# Patient Record
Sex: Male | Born: 1970 | Race: White | Hispanic: No | Marital: Single | Smoking: Never smoker
Health system: Southern US, Community
[De-identification: ages and names within clinical notes are randomized; demographics above are authoritative.]

## PROBLEM LIST (undated history)

## (undated) DIAGNOSIS — Z87442 Personal history of urinary calculi: Secondary | ICD-10-CM

## (undated) DIAGNOSIS — J189 Pneumonia, unspecified organism: Secondary | ICD-10-CM

## (undated) DIAGNOSIS — N200 Calculus of kidney: Secondary | ICD-10-CM

## (undated) DIAGNOSIS — K4090 Unilateral inguinal hernia, without obstruction or gangrene, not specified as recurrent: Secondary | ICD-10-CM

## (undated) DIAGNOSIS — Z8619 Personal history of other infectious and parasitic diseases: Secondary | ICD-10-CM

## (undated) DIAGNOSIS — I671 Cerebral aneurysm, nonruptured: Secondary | ICD-10-CM

## (undated) HISTORY — PX: HAND SURGERY: SHX662

## (undated) HISTORY — PX: OTHER SURGICAL HISTORY: SHX169

## (undated) HISTORY — DX: Personal history of other infectious and parasitic diseases: Z86.19

## (undated) HISTORY — PX: FRACTURE SURGERY: SHX138

## (undated) HISTORY — PX: ANKLE SURGERY: SHX546

## (undated) HISTORY — PX: APPENDECTOMY: SHX54

## (undated) NOTE — *Deleted (*Deleted)
Chief Complaint   No chief complaint on file.   HPI   Consult requested by: *** Reason for consult: ***  HPI: Chad Ruiz. is a 21 y.o. male {***}  Patient Active Problem List   Diagnosis Date Noted  . Lateral epicondylitis of right elbow 10/25/2017  . Encounter for health maintenance examination with abnormal findings 10/25/2017  . Orchitis and epididymitis 10/25/2017  . Abnormal liver enzymes 04/13/2012  . Inguinal pain, left 10/24/2011  . Pulmonary nodule 10/24/2011    PMH: Past Medical History:  Diagnosis Date  . Brain aneurysm    takes aspirin per MD  . History of chicken pox    childhood  . History of kidney stones   . Kidney calculi     PSH: Past Surgical History:  Procedure Laterality Date  . ANKLE SURGERY    . CLOSED REDUCTION NASAL FRACTURE N/A 10/17/2019   Procedure: CLOSED REDUCTION NASAL FRACTURE;  Surgeon: [name], MD;  Location: [facility];  Service: ENT;  Laterality: N/A;  . FRACTURE SURGERY    . HAND SURGERY    . HERNIA REPAIR  2015  . IR ANGIO INTRA EXTRACRAN SEL INTERNAL CAROTID BILAT MOD SED  12/14/2019  . IR ANGIO VERTEBRAL SEL VERTEBRAL BILAT MOD SED  12/14/2019  . nose  1996-1998   reconstruction x 3  . SEPTOPLASTY N/A 01/23/2020   Procedure: SEPTOPLASTY;  Surgeon: [name], MD;  Location:  SURGERY CENTER;  Service: ENT;  Laterality: N/A;  . TONSILLECTOMY  1979    No medications prior to admission.    SH: Social History   Tobacco Use  . Smoking status: Never Smoker  . Smokeless tobacco: Never Used  Vaping Use  . Vaping Use: Never used  Substance Use Topics  . Alcohol use: No  . Drug use: No    MEDS: Prior to Admission medications   Medication Sig Start Date End Date Taking? Authorizing Provider  aspirin EC 325 MG tablet Take 325 mg by mouth daily.    [provider]  aspirin-acetaminophen-caffeine (EXCEDRIN MIGRAINE) [phone] MG tablet Take 1 tablet by mouth every 6 (six)  hours as needed for headache.    [provider]  clopidogrel (PLAVIX) 75 MG tablet Take 75 mg by mouth daily. 12/26/19   [provider]  HYDROcodone-acetaminophen (NORCO) 7.5-325 MG tablet Take 1 tablet by mouth every 6 (six) hours as needed for moderate pain. Patient not taking: Reported on 02/22/2020 01/23/20   [name], MD  promethazine (PHENERGAN) 25 MG suppository Place 1 suppository (25 mg total) rectally every 6 (six) hours as needed for nausea or vomiting. Patient not taking: Reported on 02/22/2020 01/23/20   [name], MD    ALLERGY: No Known Allergies  Social History   Tobacco Use  . Smoking status: Never Smoker  . Smokeless tobacco: Never Used  Substance Use Topics  . Alcohol use: No     Family History  Problem Relation Age of Onset  . Hypertension Mother   . Skin cancer Father   . Breast cancer Maternal Grandmother   . Skin cancer Paternal Grandfather   . Heart disease Paternal Grandmother 47       stent placement  . Diabetes Brother 71  . Lung cancer Paternal Uncle        smoker  . Leukemia Paternal Uncle   . Prostate cancer Neg Hx   . Colon cancer Neg Hx      ROS   ROS  Exam   There were no vitals filed for this visit. General appearance: WDWN, NAD GCS: ***   - Eyes: 1- no eye opening, 2- eyes open to pain, 3-eyes open to command or shout, 4- eyes open spontaneously   - Verbal response: 1- no verbal response, 2- incomprehnsible speech/sound, 3- inappropriate response, 4- confused, but able to answer questions, 5= oriented  - Motor: 1= No motor, 2- extensor response, 3- spastic flexion, 4- withdraws from pain, 5= purposeful movement to pain, 6= obeys command Eyes: No scleral injection Cardiovascular: Regular rate and rhythm without murmurs, rubs, gallops. No edema or variciosities. Distal pulses normal. Pulmonary: Effort normal, non-labored breathing Musculoskeletal:     Muscle tone upper extremities: Normal    Muscle tone lower  extremities: Normal    Motor exam: Upper Extremities Deltoid Bicep Tricep Grip  Right 5/5 5/5 5/5 5/5  Left 5/5 5/5 5/5 5/5   Lower Extremity IP Quad PF DF EHL  Right 5/5 5/5 5/5 5/5 5/5  Left 5/5 5/5 5/5 5/5 5/5   Neurological Mental Status:    - Patient is awake, alert, oriented to person, place, month, year, and situation    - Patient is able to give a clear and coherent history.    - No signs of aphasia or neglect Cranial Nerves    - II: Visual Fields are full. PERRL    - III/IV/VI: EOMI without ptosis or diploplia.     - V: Facial sensation is grossly normal    - VII: Facial movement is symmetric.     - VIII: hearing is intact to voice    - X: Uvula elevates symmetrically    - XI: Shoulder shrug is symmetric.    - XII: tongue is midline without atrophy or fasciculations.  Sensory: Sensation grossly intact to LT Deep Tendon Reflexes    - 2+ and symmetric in the biceps and patellae. *** Plantars   - Toes are downgoing bilaterally. *** Cerebellar    - FNF and HKS are intact bilaterally***   Results - Imaging/Labs   No results found for this or any previous visit (from the past 48 hour(s)).  No results found.  IMAGING: {***}  Impression/Plan   - 75 y.o. male {***}  SAH scoring Fisher:  1: thin SAH, no IVH 2: thin SAH, +IVH 3: Thick SAH, no IVH 4: Thick SAH, +IVH  Hunt Hess Grade 1: Asx or mild headache Grade 2: Moderate to severe headache, nuchal rigidity, no neuro deficit Grade 3: drowsy, confused, mild focal deficit Grade 4: Stupor, moderate to severe hemiparesis Grade 5: come, posturing

---
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
## 2001-01-27 ENCOUNTER — Emergency Department (HOSPITAL_COMMUNITY): Admission: EM | Admit: 2001-01-27 | Discharge: 2001-01-27 | Payer: Self-pay | Admitting: Emergency Medicine

## 2011-09-29 ENCOUNTER — Emergency Department (INDEPENDENT_AMBULATORY_CARE_PROVIDER_SITE_OTHER): Payer: BC Managed Care – PPO

## 2011-09-29 ENCOUNTER — Encounter (HOSPITAL_BASED_OUTPATIENT_CLINIC_OR_DEPARTMENT_OTHER): Payer: Self-pay | Admitting: *Deleted

## 2011-09-29 ENCOUNTER — Emergency Department (HOSPITAL_BASED_OUTPATIENT_CLINIC_OR_DEPARTMENT_OTHER)
Admission: EM | Admit: 2011-09-29 | Discharge: 2011-09-29 | Disposition: A | Discharge status: Home / Self Care | Payer: BC Managed Care – PPO | Attending: Emergency Medicine | Admitting: Emergency Medicine

## 2011-09-29 ENCOUNTER — Emergency Department (HOSPITAL_BASED_OUTPATIENT_CLINIC_OR_DEPARTMENT_OTHER): Payer: BC Managed Care – PPO

## 2011-09-29 DIAGNOSIS — R109 Unspecified abdominal pain: Secondary | ICD-10-CM

## 2011-09-29 DIAGNOSIS — R911 Solitary pulmonary nodule: Secondary | ICD-10-CM

## 2011-09-29 DIAGNOSIS — K5730 Diverticulosis of large intestine without perforation or abscess without bleeding: Secondary | ICD-10-CM

## 2011-09-29 DIAGNOSIS — M549 Dorsalgia, unspecified: Secondary | ICD-10-CM | POA: Insufficient documentation

## 2011-09-29 HISTORY — DX: Calculus of kidney: N20.0

## 2011-09-29 LAB — URINALYSIS, ROUTINE W REFLEX MICROSCOPIC
Bilirubin Urine: NEGATIVE
Hgb urine dipstick: NEGATIVE
Ketones, ur: NEGATIVE mg/dL
Nitrite: NEGATIVE
Specific Gravity, Urine: 1.026 (ref 1.005–1.030)
Urobilinogen, UA: 0.2 mg/dL (ref 0.0–1.0)

## 2011-09-29 MED ORDER — METRONIDAZOLE 500 MG PO TABS: 500.0000 mg | ORAL_TABLET | Freq: Two times a day (BID) | ORAL | Status: AC

## 2011-09-29 MED ORDER — CIPROFLOXACIN HCL 500 MG PO TABS
500.0000 mg | ORAL_TABLET | Freq: Once | ORAL | Status: AC
  Administered 2011-09-29: 500 mg via ORAL
  Filled 2011-09-29: qty 1

## 2011-09-29 MED ORDER — METRONIDAZOLE 500 MG PO TABS
500.0000 mg | ORAL_TABLET | Freq: Once | ORAL | Status: AC
  Administered 2011-09-29: 500 mg via ORAL
  Filled 2011-09-29: qty 1

## 2011-09-29 MED ORDER — IBUPROFEN 800 MG PO TABS
800.0000 mg | ORAL_TABLET | Freq: Once | ORAL | Status: AC
  Administered 2011-09-29: 800 mg via ORAL
  Filled 2011-09-29: qty 1

## 2011-09-29 MED ORDER — ONDANSETRON 4 MG PO TBDP
4.0000 mg | ORAL_TABLET | Freq: Once | ORAL | Status: AC
  Administered 2011-09-29: 4 mg via ORAL
  Filled 2011-09-29: qty 1

## 2011-09-29 MED ORDER — OXYCODONE-ACETAMINOPHEN 5-325 MG PO TABS: 1.0000 | ORAL_TABLET | Freq: Four times a day (QID) | ORAL | Status: AC | PRN

## 2011-09-29 MED ORDER — NAPROXEN 500 MG PO TABS: 500.0000 mg | ORAL_TABLET | Freq: Two times a day (BID) | ORAL | Status: DC

## 2011-09-29 MED ORDER — CIPROFLOXACIN HCL 500 MG PO TABS: 500.0000 mg | ORAL_TABLET | Freq: Two times a day (BID) | ORAL | Status: AC

## 2011-09-29 NOTE — ED Notes (Signed)
Patient transported to CT 

## 2011-09-29 NOTE — Discharge Instructions (Signed)

## 2011-09-29 NOTE — ED Provider Notes (Addendum)
History     CSN: [id number]  Arrival date & time 09/29/11  1954   First MD Initiated Contact with Patient 09/29/11 2021      Chief Complaint  Patient presents with  . Flank Pain    (Consider location/radiation/quality/duration/timing/severity/associated sxs/prior treatment) HPI Comments: History of kidney stones. His had left flank pain radiating around to his left lower abdomen and groin over the past 2 weeks. States is now localized to the left lower quadrant and has remained stable for the past week. His had difficulty urinating in the past 1-2 days.  Patient is a 41 y.o. male presenting with flank pain. The history is provided by the patient. No language interpreter was used.  Flank Pain This is a new problem. Episode onset: 3 weeks ago. The problem occurs constantly. The problem has been gradually worsening. Associated symptoms include abdominal pain. Pertinent negatives include no chest pain, no headaches and no shortness of breath. The symptoms are aggravated by nothing. The symptoms are relieved by nothing. He has tried nothing for the symptoms.    Past Medical History  Diagnosis Date  . Kidney calculi     Past Surgical History  Procedure Date  . Tonsillectomy   . Joint replacement     History reviewed. No pertinent family history.  History  Substance Use Topics  . Smoking status: Never Smoker   . Smokeless tobacco: Not on file  . Alcohol Use: No      Review of Systems  Constitutional: Negative for fever, chills, activity change, appetite change and fatigue.  HENT: Negative for congestion, sore throat, rhinorrhea, neck pain and neck stiffness.   Respiratory: Negative for cough and shortness of breath.   Cardiovascular: Negative for chest pain and palpitations.  Gastrointestinal: Positive for abdominal pain. Negative for nausea and vomiting.  Genitourinary: Positive for flank pain. Negative for dysuria, urgency, frequency and testicular pain.    Musculoskeletal: Positive for back pain. Negative for myalgias and arthralgias.  Neurological: Negative for dizziness, weakness, light-headedness, numbness and headaches.  All other systems reviewed and are negative.    Allergies  Review of patient's allergies indicates no known allergies.  Home Medications   Current Outpatient Rx  Name Route Sig Dispense Refill  . GOODY HEADACHE PO Oral Take 1 packet by mouth daily as needed. Patient used this medication for headache.    Marland Kitchen CIPROFLOXACIN HCL 500 MG PO TABS Oral Take 1 tablet (500 mg total) by mouth 2 (two) times daily. 28 tablet 0  . METRONIDAZOLE 500 MG PO TABS Oral Take 1 tablet (500 mg total) by mouth 2 (two) times daily. 20 tablet 0  . NAPROXEN 500 MG PO TABS Oral Take 1 tablet (500 mg total) by mouth 2 (two) times daily. 30 tablet 0  . OXYCODONE-ACETAMINOPHEN 5-325 MG PO TABS Oral Take 1-2 tablets by mouth every 6 (six) hours as needed for pain. 20 tablet 0    BP 136/98  Pulse 74  Temp 97.4 F (36.3 C)  Resp 16  Ht 5\' 8"  (1.727 m)  Wt 162 lb (73.483 kg)  BMI 24.63 kg/m2  SpO2 100%  Physical Exam  Nursing note and vitals reviewed. Constitutional: He is oriented to person, place, and time. He appears well-developed and well-nourished. No distress.  HENT:  Head: Normocephalic and atraumatic.  Mouth/Throat: Oropharynx is clear and moist.  Eyes: Conjunctivae and EOM are normal. Pupils are equal, round, and reactive to light.  Neck: Normal range of motion. Neck supple.  Cardiovascular: Normal rate,  regular rhythm, normal heart sounds and intact distal pulses.  Exam reveals no gallop and no friction rub.   No murmur heard. Pulmonary/Chest: Effort normal and breath sounds normal. No respiratory distress. He exhibits no tenderness.  Abdominal: Soft. Bowel sounds are normal. There is no tenderness. There is no rebound and no guarding. Hernia confirmed negative in the right inguinal area and confirmed negative in the left  inguinal area.       No cva tenderness  Genitourinary: Testes normal. Right testis shows no mass and no tenderness. Left testis shows no mass and no tenderness. Circumcised.  Musculoskeletal: Normal range of motion. He exhibits no tenderness.  Neurological: He is alert and oriented to person, place, and time. No cranial nerve deficit.  Skin: Skin is warm and dry. No rash noted.    ED Course  Procedures (including critical care time)   Labs Reviewed  URINALYSIS, ROUTINE W REFLEX MICROSCOPIC   Ct Abdomen Pelvis Wo Contrast  09/29/2011  *RADIOLOGY REPORT*  Clinical Data: Left groin and flank pain.  Question renal stone.  CT ABDOMEN AND PELVIS WITHOUT CONTRAST  Technique:  Multidetector CT imaging of the abdomen and pelvis was performed following the standard protocol without intravenous contrast.  Comparison: None.  Findings: Right middle lobe 6 mm nodule (series 4 image 3) If the patient is at high risk for bronchogenic carcinoma, follow-up chest CT at 6-12 months is recommended.  If the patient is at low risk for bronchogenic carcinoma, follow-up chest CT at 12 months is recommended.  This recommendation follows the consensus statement: Guidelines for Management of Small Pulmonary Nodules Detected on CT Scans: A Statement from the Fleischner Society as published in Radiology 2005; 237:395-400.  No renal or ureteral calculi or findings of renal collecting system obstruction.  Scattered diverticula throughout the colon without findings of diverticulitis.  Overall, no extraluminal bowel inflammatory process, free fluid or free air.  Prostate gland minimally lobulated with central calcifications. Clinical and laboratory correlation recommended.  Evaluation of solid abdominal viscera is limited by lack of IV contrast.  Taking this limitation into account no focal hepatic, splenic, pancreatic, adrenal or renal lesion.  Contracted gallbladder without calcified gallstones.  No abdominal aortic aneurysm.  No  retroperitoneal, pelvic or abdominal adenopathy.  No bony destructive lesion.  IMPRESSION: Right middle lobe 6 mm nodule with follow up as discussed above.  No renal or ureteral calculi or findings of renal collecting system obstruction.  Scattered diverticula throughout the colon without findings of diverticulitis.  Original Report Authenticated By: Fuller Canada, M.D.     1. Flank pain       MDM  Flank pain of uncertain etiology. He does have evidence of diverticulosis on this noncontrast CT scan therefore I will cover him for diverticulitis. Otherwise unremarkable etiologies. He was be prescribed pain medication. Instructed to followup with the provided primary care physician. Aorta unremarkable. He has no evidence of renal stone. Urine is negative for infection. Testicular exam is normal therefore I have no concern for testicular etiology to his pain. There is no hernias.        Dayton Bailiff, MD 09/29/11 1610  Dayton Bailiff, MD 09/29/11 2237

## 2011-09-29 NOTE — ED Notes (Signed)
Pt c/o left flank pain x 3 weeks , hx kidney stones

## 2011-10-18 ENCOUNTER — Ambulatory Visit: Payer: BC Managed Care – PPO | Admitting: Internal Medicine

## 2011-10-19 ENCOUNTER — Encounter: Payer: Self-pay | Admitting: Internal Medicine

## 2011-10-19 ENCOUNTER — Ambulatory Visit (INDEPENDENT_AMBULATORY_CARE_PROVIDER_SITE_OTHER): Payer: BC Managed Care – PPO | Admitting: Internal Medicine

## 2011-10-19 DIAGNOSIS — R109 Unspecified abdominal pain: Secondary | ICD-10-CM

## 2011-10-19 DIAGNOSIS — R911 Solitary pulmonary nodule: Secondary | ICD-10-CM

## 2011-10-19 LAB — CBC WITH DIFFERENTIAL/PLATELET
Basophils Relative: 1 % (ref 0–1)
Eosinophils Absolute: 0.2 10*3/uL (ref 0.0–0.7)
Eosinophils Relative: 3 % (ref 0–5)
Lymphs Abs: 2.2 10*3/uL (ref 0.7–4.0)
MCH: 29.3 pg (ref 26.0–34.0)
MCHC: 33.7 g/dL (ref 30.0–36.0)
MCV: 87 fL (ref 78.0–100.0)
Monocytes Relative: 6 % (ref 3–12)
Platelets: 221 10*3/uL (ref 150–400)
RBC: 5.77 MIL/uL (ref 4.22–5.81)

## 2011-10-19 LAB — HEPATIC FUNCTION PANEL
Albumin: 4.6 g/dL (ref 3.5–5.2)
Total Bilirubin: 0.6 mg/dL (ref 0.3–1.2)
Total Protein: 7.1 g/dL (ref 6.0–8.3)

## 2011-10-19 LAB — BASIC METABOLIC PANEL
CO2: 23 mEq/L (ref 19–32)
Calcium: 9.5 mg/dL (ref 8.4–10.5)
Creat: 1.09 mg/dL (ref 0.50–1.35)
Glucose, Bld: 88 mg/dL (ref 70–99)

## 2011-10-19 MED ORDER — AMOXICILLIN-POT CLAVULANATE 875-125 MG PO TABS: 1.0000 | ORAL_TABLET | Freq: Two times a day (BID) | ORAL | Status: AC

## 2011-10-24 DIAGNOSIS — R1032 Left lower quadrant pain: Secondary | ICD-10-CM | POA: Insufficient documentation

## 2011-10-24 DIAGNOSIS — R911 Solitary pulmonary nodule: Secondary | ICD-10-CM | POA: Insufficient documentation

## 2011-10-24 NOTE — Assessment & Plan Note (Signed)
New incidental finding on ct. No tobacco hx. Recommend chest ct in 6months for interval change

## 2011-10-24 NOTE — Progress Notes (Signed)
  Subjective:    Patient ID: Chad Ruiz, male    DOB: 06-07-1971, 41 y.o.   MRN: [id number]  HPI Pt presents to clinic for evaluation of multiple medical problems. Notes 5wk h/o llq pain s/p ed visit. CT of abd showed no kidney stones but diverticuli without inflammation. Given cipro/flagyl course and notes improvement. Pain changed from constant to intermittent. No other alleviating or exacerbating factors.  Flagyl provided side effects however. CT also suggested 6mm RML lung nodule without suspicious characteristics. No tobacco hx.   Past Medical History  Diagnosis Date  . Kidney calculi   . History of chicken pox     childhood   Past Surgical History  Procedure Date  . Tonsillectomy 1979  . Nose [phone]    reconstruction x 3    reports that he has never smoked. He has never used smokeless tobacco. He reports that he does not drink alcohol or use illicit drugs. family history includes Breast cancer in his maternal grandmother; Diabetes (age of onset:17) in his brother; Heart disease (age of onset:70) in his paternal grandmother; Hypertension in his mother; and Skin cancer in his paternal grandfather.  There is no history of Prostate cancer and Colon cancer. No Known Allergies   Review of Systems  Respiratory: Negative for cough and shortness of breath.   Cardiovascular: Negative for chest pain.  Gastrointestinal: Positive for abdominal pain. Negative for vomiting, diarrhea and blood in stool.  All other systems reviewed and are negative.       Objective:   Physical Exam  Nursing note and vitals reviewed. Constitutional: He appears well-developed and well-nourished. No distress.  HENT:  Head: Normocephalic and atraumatic.  Right Ear: External ear normal.  Left Ear: External ear normal.  Eyes: Conjunctivae are normal. No scleral icterus.  Neck: Neck supple.  Cardiovascular: Normal rate, regular rhythm and normal heart sounds.  Exam reveals no gallop and no friction rub.    No murmur heard. Pulmonary/Chest: Effort normal and breath sounds normal. No respiratory distress. He has no wheezes. He has no rales.  Abdominal: Soft. Bowel sounds are normal. He exhibits no distension and no mass. There is no tenderness. There is no rebound and no guarding.  Neurological: He is alert.  Skin: Skin is warm and dry. He is not diaphoretic.  Psychiatric: He has a normal mood and affect.          Assessment & Plan:

## 2011-10-24 NOTE — Assessment & Plan Note (Signed)
Clinically improving with abx tx for possible diverticulitis. Flagyl with side effects. Attempt augmentin monotherapy. Obtain labs. Consider gi consult if no resolution.

## 2011-11-26 ENCOUNTER — Telehealth: Payer: Self-pay | Admitting: Internal Medicine

## 2011-11-26 NOTE — Telephone Encounter (Signed)
Patient wants refill to GI for abd pain and diarrhea

## 2011-11-26 NOTE — Telephone Encounter (Signed)
Call placed to patient at [phone], no answer. A detailed voice message was left for patient to return phone call regarding additional information regarding his referral request.

## 2011-11-29 ENCOUNTER — Other Ambulatory Visit: Payer: Self-pay | Admitting: Internal Medicine

## 2011-11-29 DIAGNOSIS — R109 Unspecified abdominal pain: Secondary | ICD-10-CM

## 2011-11-29 NOTE — Telephone Encounter (Signed)
Referral order placed.

## 2011-11-29 NOTE — Telephone Encounter (Signed)
Patient returned phone call stating that he is still having lower left abdominal pain and burning. He stated that he previously talked with Dr Rodena Medin regarding the referral if his symptoms were no resolved.He would like to know if the GI appointment could be made as soon as possible. He was advised he would be contacted within one week regarding the GI appointment date and time. He was advised to call back if he has not heard from the office within one week. Patient has verbalized understanding and agrees as instructed.

## 2011-12-02 ENCOUNTER — Ambulatory Visit (INDEPENDENT_AMBULATORY_CARE_PROVIDER_SITE_OTHER): Payer: BC Managed Care – PPO | Admitting: Internal Medicine

## 2011-12-02 ENCOUNTER — Encounter: Payer: Self-pay | Admitting: Internal Medicine

## 2011-12-02 VITALS — BP 130/96 | HR 78 | Ht 68.0 in | Wt 162.6 lb

## 2011-12-02 DIAGNOSIS — K594 Anal spasm: Secondary | ICD-10-CM

## 2011-12-02 DIAGNOSIS — R1032 Left lower quadrant pain: Secondary | ICD-10-CM

## 2011-12-02 DIAGNOSIS — R195 Other fecal abnormalities: Secondary | ICD-10-CM

## 2011-12-02 MED ORDER — MOVIPREP 100 G PO SOLR: ORAL | Status: DC

## 2011-12-02 MED ORDER — HYOSCYAMINE SULFATE 0.125 MG SL SUBL: 0.1250 mg | SUBLINGUAL_TABLET | SUBLINGUAL | Status: DC | PRN

## 2011-12-02 NOTE — Progress Notes (Signed)
Referred by: Edwyna Perfect, MD  Subjective:    Patient ID: Chad Ruiz, male    DOB: 06-Apr-1971, 41 y.o.   MRN: [id number]  HPI This is a pleasant young white man who was in his usual state of health until late April. He thought he might be having a recurrent kidney stone attack because of left lower quadrant pain. It became severe. He was evaluated in the emergency department with an unenhanced CT scan that did not show any obvious pathology that we have diverticulosis. He was empirically treated for diverticulitis with Cipro and Flagyl and improved some. The pain was sharp and crampy and intense. He noted improvement but developed some loose stools. He followed up with primary care, he was having a fair amount of nausea on the Cipro and Flagyl so Augmentin was used. The pain is better but still a problem. His stools have been loose and somewhat irregular though that may be improving. He has not noted any rectal bleeding or fever. He thinks caffeine might make things somewhat worse.  There is a background history of intermittent painful rectal spasms. They often awaking him from sleep. It can last up to a half an hour. He is having some tenesmus also.  GI review of systems is otherwise negative  Medications, allergies, past medical history, past surgical history, family history and social history are reviewed and updated in the EMR.  Review of Systems All other review of systems negative or as reflected in the history of present illness.    Objective:   Physical Exam General:  Well-developed, well-nourished and in no acute distress Eyes:  anicteric. ENT:   Mouth and posterior pharynx free of lesions.  Neck:   supple w/o thyromegaly or mass.  Lungs: Clear to auscultation bilaterally. Heart:  S1S2, no rubs, murmurs, gallops. Abdomen:  soft, non-tender, no hepatosplenomegaly, hernia, or mass and BS+.  Rectal: Normal anoderm, normal tone, brown heme = stool, no mass. Prostate is  normal. Lymph:  no cervical or supraclavicular adenopathy. Extremities:   no edema Skin   no rash. Tattoo on back Neuro:  A&O x 3.  Psych:  appropriate mood and  Affect.   Data Reviewed: Lab Results  Component Value Date   WBC 6.8 10/19/2011   HGB 16.9 10/19/2011   HCT 50.2 10/19/2011   MCV 87.0 10/19/2011   PLT 221 10/19/2011   I reviewed the CT scan the emergency department note as well as his primary care note.       Assessment & Plan:   1. LLQ pain   2. Heme + stool   3. Proctalgia fugax    Etiology of his problems is not clear to me. The fact that he has the rectal spasms which are proctalgia fugax raises some question of IBS. He has a heme positive stool on rectal exam, he could have some sort of colitis. The bowel habit changes really seem to be related to the antibiotics. Given the overall scenario we have decided to proceed with a colonoscopy. I have prescribed hyoscyamine 0.125 mg sublingual to use intermittently for abdominal pain for the proctalgia fugax. Further plans pending his diagnostic evaluation and therapeutic trial.The risks and benefits as well as alternatives of endoscopic procedure(s) have been discussed and reviewed. All questions answered. The patient agrees to proceed.  I appreciate the opportunity to care for this patient.  CC: Estill Cotta, MD

## 2011-12-02 NOTE — Patient Instructions (Addendum)
Hyoscyamine was prescribed to help abdominal pain and rectal spasms.  You have been scheduled for a colonoscopy with propofol. Please follow written instructions given to you at your visit today.  Please pick up your prep kit at the pharmacy within the next 1-3 days.

## 2011-12-03 ENCOUNTER — Encounter: Payer: Self-pay | Admitting: Internal Medicine

## 2011-12-17 ENCOUNTER — Ambulatory Visit (AMBULATORY_SURGERY_CENTER): Payer: BC Managed Care – PPO | Admitting: Internal Medicine

## 2011-12-17 ENCOUNTER — Encounter: Payer: Self-pay | Admitting: Internal Medicine

## 2011-12-17 VITALS — BP 117/83 | HR 70 | Temp 96.7°F | Resp 18 | Ht 68.0 in | Wt 162.0 lb

## 2011-12-17 DIAGNOSIS — K648 Other hemorrhoids: Secondary | ICD-10-CM

## 2011-12-17 DIAGNOSIS — R195 Other fecal abnormalities: Secondary | ICD-10-CM

## 2011-12-17 DIAGNOSIS — R1032 Left lower quadrant pain: Secondary | ICD-10-CM

## 2011-12-17 MED ORDER — SODIUM CHLORIDE 0.9 % IV SOLN: 500.0000 mL | INTRAVENOUS | Status: DC

## 2011-12-17 NOTE — Op Note (Signed)
[facility] [address] [place], [place]  [address]  COLONOSCOPY PROCEDURE REPORT  [name], Chad Ruiz  MR#:  [id number] BIRTHDATE:  [birth year], 40 yrs. old  GENDER:  male ENDOSCOPIST:  Iva Boop, MD, [facility] REF. BY:  Charlynn Court, M.D. PROCEDURE DATE:  12/17/2011 PROCEDURE:  Colonoscopy 19[address] ASA CLASS:  Class II INDICATIONS:  heme positive stool MEDICATIONS:   MAC sedation, administered by CRNA, propofol (Diprivan) 160 mg IV  DESCRIPTION OF PROCEDURE:   After Chad risks benefits and alternatives of Chad procedure were thoroughly explained, informed consent was obtained.  Digital rectal exam was performed and revealed no abnormalities and normal prostate.   Chad LB CF-[id number] endoscope was introduced through Chad anus and advanced to Chad cecum, which was identified by both Chad appendix and ileocecal valve, without limitations.  Chad quality of Chad prep was excellent, using MoviPrep.  Chad instrument was then slowly withdrawn as Chad colon was fully examined. <<PROCEDUREIMAGES>>  FINDINGS:  A normal appearing cecum, ileocecal valve, and appendiceal orifice were identified. Chad ascending, hepatic flexure, transverse, splenic flexure, descending, sigmoid colon, and rectum appeared unremarkable.   Retroflexed views in Chad rectum revealed internal hemorrhoids.    Chad time to cecum = 3:26 minutes. Chad scope was then withdrawn in 7:30 minutes from Chad cecum and Chad procedure completed. COMPLICATIONS:  None ENDOSCOPIC IMPRESSION: 1) Normal colon 2) Internal hemorrhoids RECOMMENDATIONS: 1) use hyoscyamine for abdominal pain - I think he has IBS 2) Follow-up Dr. Leone Payor as needed  Iva Boop, MD, [facility]  CC:  Clifton Custard MD and Chad Ruiz  n. eSIGNED:   Iva Boop at 12/17/2011 11:40 AM  Nestor Ramp, [phone]

## 2011-12-17 NOTE — Progress Notes (Signed)
Pt. C/o abdominal pain when first admitted to recovery. Pt. Stated it was same pain he came to see doctor for. Dr. Leone Payor made aware and spoke with pt. About it and instructed pt. To try the medication he prescribed for him.

## 2011-12-17 NOTE — Progress Notes (Signed)
Patient did not experience any of the following events: a burn prior to discharge; a fall within the facility; wrong site/side/patient/procedure/implant event; or a hospital transfer or hospital admission upon discharge from the facility. (G8907) Patient did not have preoperative order for IV antibiotic SSI prophylaxis. (G8918)  

## 2011-12-17 NOTE — Patient Instructions (Addendum)
You have internal hemorrhoids - I believe that is where the blood came from. Please try the hyoscyamine for your abdominal pain. If things do not improve you can return to see me in the office. Iva Boop, MD, FACG   YOU HAD AN ENDOSCOPIC PROCEDURE TODAY AT THE [facility]: Refer to the procedure report that was given to you for any specific questions about what was found during the examination.  If the procedure report does not answer your questions, please call your gastroenterologist to clarify.  If you requested that your care partner not be given the details of your procedure findings, then the procedure report has been included in a sealed envelope for you to review at your convenience later.  YOU SHOULD EXPECT: Some feelings of bloating in the abdomen. Passage of more gas than usual.  Walking can help get rid of the air that was put into your GI tract during the procedure and reduce the bloating. If you had a lower endoscopy (such as a colonoscopy or flexible sigmoidoscopy) you may notice spotting of blood in your stool or on the toilet paper. If you underwent a bowel prep for your procedure, then you may not have a normal bowel movement for a few days.  DIET: Your first meal following the procedure should be a light meal and then it is ok to progress to your normal diet.  A half-sandwich or bowl of soup is an example of a good first meal.  Heavy or fried foods are harder to digest and may make you feel nauseous or bloated.  Likewise meals heavy in dairy and vegetables can cause extra gas to form and this can also increase the bloating.  Drink plenty of fluids but you should avoid alcoholic beverages for 24 hours.  ACTIVITY: Your care partner should take you home directly after the procedure.  You should plan to take it easy, moving slowly for the rest of the day.  You can resume normal activity the day after the procedure however you should NOT DRIVE or use heavy machinery for  24 hours (because of the sedation medicines used during the test).    SYMPTOMS TO REPORT IMMEDIATELY: A gastroenterologist can be reached at any hour.  During normal business hours, 8:30 AM to 5:00 PM Monday through Friday, call [phone].  After hours and on weekends, please call the GI answering service at [phone] who will take a message and have the physician on call contact you.   Following lower endoscopy (colonoscopy or flexible sigmoidoscopy):  Excessive amounts of blood in the stool  Significant tenderness or worsening of abdominal pains  Swelling of the abdomen that is new, acute  Fever of 100F or higher   FOLLOW UP: If any biopsies were taken you will be contacted by phone or by letter within the next 1-3 weeks.  Call your gastroenterologist if you have not heard about the biopsies in 3 weeks.  Our staff will call the home number listed on your records the next business day following your procedure to check on you and address any questions or concerns that you may have at that time regarding the information given to you following your procedure. This is a courtesy call and so if there is no answer at the home number and we have not heard from you through the emergency physician on call, we will assume that you have returned to your regular daily activities without incident.  SIGNATURES/CONFIDENTIALITY:  Resume medications. Information  for hemorrhoids and high fiber diet given with discharge instructions. You and/or your care partner have signed paperwork which will be entered into your electronic medical record.  These signatures attest to the fact that that the information above on your After Visit Summary has been reviewed and is understood.  Full responsibility of the confidentiality of this discharge information lies with you and/or your care-partner.

## 2011-12-20 ENCOUNTER — Telehealth: Payer: Self-pay | Admitting: *Deleted

## 2011-12-20 NOTE — Telephone Encounter (Signed)
Left message

## 2012-04-13 ENCOUNTER — Encounter: Payer: Self-pay | Admitting: Internal Medicine

## 2012-04-13 ENCOUNTER — Ambulatory Visit (INDEPENDENT_AMBULATORY_CARE_PROVIDER_SITE_OTHER): Payer: BC Managed Care – PPO | Admitting: Internal Medicine

## 2012-04-13 ENCOUNTER — Ambulatory Visit (HOSPITAL_BASED_OUTPATIENT_CLINIC_OR_DEPARTMENT_OTHER)
Admission: RE | Admit: 2012-04-13 | Discharge: 2012-04-13 | Disposition: A | Discharge status: Home / Self Care | Payer: BC Managed Care – PPO | Source: Ambulatory Visit | Attending: Internal Medicine | Admitting: Internal Medicine

## 2012-04-13 VITALS — BP 122/92 | HR 64 | Temp 98.1°F | Resp 16 | Ht 68.0 in | Wt 163.2 lb

## 2012-04-13 DIAGNOSIS — R748 Abnormal levels of other serum enzymes: Secondary | ICD-10-CM | POA: Insufficient documentation

## 2012-04-13 DIAGNOSIS — R109 Unspecified abdominal pain: Secondary | ICD-10-CM

## 2012-04-13 DIAGNOSIS — R911 Solitary pulmonary nodule: Secondary | ICD-10-CM | POA: Insufficient documentation

## 2012-04-13 DIAGNOSIS — Z13220 Encounter for screening for lipoid disorders: Secondary | ICD-10-CM

## 2012-04-13 DIAGNOSIS — Z79899 Other long term (current) drug therapy: Secondary | ICD-10-CM

## 2012-04-13 LAB — BASIC METABOLIC PANEL
BUN: 19 mg/dL (ref 6–23)
CO2: 29 mEq/L (ref 19–32)
Chloride: 107 mEq/L (ref 96–112)
Glucose, Bld: 79 mg/dL (ref 70–99)
Potassium: 4.5 mEq/L (ref 3.5–5.3)

## 2012-04-13 LAB — LIPID PANEL
LDL Cholesterol: 132 mg/dL — ABNORMAL HIGH (ref 0–99)
VLDL: 17 mg/dL (ref 0–40)

## 2012-04-13 LAB — HEPATIC FUNCTION PANEL
Bilirubin, Direct: 0.1 mg/dL (ref 0.0–0.3)
Indirect Bilirubin: 0.5 mg/dL (ref 0.0–0.9)

## 2012-04-13 MED ORDER — IOHEXOL 300 MG/ML  SOLN
80.0000 mL | Freq: Once | INTRAMUSCULAR | Status: AC | PRN
  Administered 2012-04-13: 80 mL via INTRAVENOUS

## 2012-04-13 NOTE — Assessment & Plan Note (Signed)
Schedule repeat chest CT with contrast

## 2012-04-13 NOTE — Assessment & Plan Note (Signed)
Resolved

## 2012-04-13 NOTE — Assessment & Plan Note (Signed)
Minimal previous elevation. Repeat liver function tests

## 2012-04-13 NOTE — Progress Notes (Signed)
  Subjective:    Patient ID: Chad Limbo., male    DOB: 11/20/1970, 41 y.o.   MRN: [id number]  HPI Pt presents to clinic for followup of multiple medical problems. Reviewed past CT with incidental finding of 6 mm right middle lobe nodule dated February 2013. Asymptomatic without cough hemoptysis sweats or weight loss. Previous abdominal pain now resolved. Status post GI consultation for colonoscopy finding only significant for internal hemorrhoids. Reviewed past minimal elevation of ALT at 54.  Past Medical History  Diagnosis Date  . Kidney calculi   . History of chicken pox     childhood   Past Surgical History  Procedure Date  . Tonsillectomy 1979  . Nose 1996-1998    reconstruction x 3  . Hand surgery   . Ankle surgery     reports that he has never smoked. He has never used smokeless tobacco. He reports that he does not drink alcohol or use illicit drugs. family history includes Breast cancer in his maternal grandmother; Diabetes (age of onset:17) in his brother; Heart disease (age of onset:70) in his paternal grandmother; Hypertension in his mother; Leukemia in his paternal uncle; Lung cancer in his paternal uncle; and Skin cancer in his father and paternal grandfather.  There is no history of Prostate cancer and Colon cancer. No Known Allergies    Review of Systems see hpi     Objective:   Physical Exam  Nursing note and vitals reviewed. Constitutional: He appears well-developed and well-nourished. No distress.  HENT:  Head: Normocephalic and atraumatic.  Eyes: Conjunctivae normal are normal. No scleral icterus.  Pulmonary/Chest: Effort normal and breath sounds normal. No respiratory distress. He has no wheezes. He has no rales.  Neurological: He is alert.  Skin: He is not diaphoretic.  Psychiatric: He has a normal mood and affect.        Assessment & Plan:

## 2013-06-21 HISTORY — PX: HERNIA REPAIR: SHX51

## 2015-01-17 ENCOUNTER — Telehealth: Payer: Self-pay | Admitting: Surgery

## 2015-01-17 NOTE — Telephone Encounter (Signed)
Chad Ruiz had a lap inguinal hernia repair today, 01/17/2015, at the [facility] by Dr. Derrell Lolling.  I first spoke to his girlfriend, Chad Ruiz.  He is have some burning when he urinates.  He said that he has been cathed before - I tried to reassure him that this should get better.  If it persists, he will call us Monday, 01/20/2015.  [name], MD, [facility] Pager: [phone] Office phone:  [phone]

## 2017-10-25 ENCOUNTER — Other Ambulatory Visit (HOSPITAL_COMMUNITY)
Admission: RE | Admit: 2017-10-25 | Discharge: 2017-10-25 | Disposition: A | Discharge status: Home / Self Care | Payer: Managed Care, Other (non HMO) | Source: Ambulatory Visit | Attending: Family Medicine | Admitting: Family Medicine

## 2017-10-25 ENCOUNTER — Encounter: Payer: Self-pay | Admitting: Family Medicine

## 2017-10-25 ENCOUNTER — Ambulatory Visit (INDEPENDENT_AMBULATORY_CARE_PROVIDER_SITE_OTHER): Payer: Managed Care, Other (non HMO) | Admitting: Family Medicine

## 2017-10-25 VITALS — BP 130/90 | HR 102 | Ht 68.0 in | Wt 168.5 lb

## 2017-10-25 DIAGNOSIS — Z0001 Encounter for general adult medical examination with abnormal findings: Secondary | ICD-10-CM | POA: Diagnosis not present

## 2017-10-25 DIAGNOSIS — R1032 Left lower quadrant pain: Secondary | ICD-10-CM

## 2017-10-25 DIAGNOSIS — M7711 Lateral epicondylitis, right elbow: Secondary | ICD-10-CM | POA: Insufficient documentation

## 2017-10-25 DIAGNOSIS — N453 Epididymo-orchitis: Secondary | ICD-10-CM | POA: Insufficient documentation

## 2017-10-25 LAB — COMPREHENSIVE METABOLIC PANEL
ALBUMIN: 4.1 g/dL (ref 3.5–5.2)
ALK PHOS: 58 U/L (ref 39–117)
ALT: 27 U/L (ref 0–53)
AST: 17 U/L (ref 0–37)
BUN: 18 mg/dL (ref 6–23)
CO2: 23 mEq/L (ref 19–32)
Calcium: 9 mg/dL (ref 8.4–10.5)
Chloride: 106 mEq/L (ref 96–112)
Creatinine, Ser: 1.09 mg/dL (ref 0.40–1.50)
GFR: 77.84 mL/min (ref >60.00)
GLUCOSE: 120 mg/dL — AB (ref 70–99)
Potassium: 4.1 mEq/L (ref 3.5–5.1)
Sodium: 141 mEq/L (ref 135–145)
TOTAL PROTEIN: 6.6 g/dL (ref 6.0–8.3)
Total Bilirubin: 0.5 mg/dL (ref 0.2–1.2)

## 2017-10-25 LAB — LIPID PANEL
CHOLESTEROL: 202 mg/dL — AB (ref 0–200)
HDL: 46.7 mg/dL (ref >39.00)
LDL Cholesterol: 136 mg/dL — ABNORMAL HIGH (ref 0–99)
NONHDL: 155.32
Total CHOL/HDL Ratio: 4
Triglycerides: 99 mg/dL (ref 0.0–149.0)
VLDL: 19.8 mg/dL (ref 0.0–40.0)

## 2017-10-25 LAB — URINALYSIS, ROUTINE W REFLEX MICROSCOPIC
Bilirubin Urine: NEGATIVE
HGB URINE DIPSTICK: NEGATIVE
Ketones, ur: NEGATIVE
LEUKOCYTES UA: NEGATIVE
NITRITE: NEGATIVE
RBC / HPF: NONE SEEN (ref 0–?)
Specific Gravity, Urine: 1.02 (ref 1.000–1.030)
Total Protein, Urine: NEGATIVE
URINE GLUCOSE: NEGATIVE
Urobilinogen, UA: 0.2 (ref 0.0–1.0)
pH: 5.5 (ref 5.0–8.0)

## 2017-10-25 LAB — CBC
HCT: 49.7 % (ref 39.0–52.0)
Hemoglobin: 16.4 g/dL (ref 13.0–17.0)
MCHC: 33 g/dL (ref 30.0–36.0)
MCV: 87.6 fl (ref 78.0–100.0)
PLATELETS: 222 10*3/uL (ref 150.0–400.0)
RBC: 5.67 Mil/uL (ref 4.22–5.81)
RDW: 13.4 % (ref 11.5–15.5)
WBC: 5.8 10*3/uL (ref 4.0–10.5)

## 2017-10-25 MED ORDER — DOXYCYCLINE HYCLATE 100 MG PO TABS: 100.0000 mg | ORAL_TABLET | Freq: Two times a day (BID) | ORAL | 0 refills | Status: DC

## 2017-10-25 MED ORDER — DICLOFENAC SODIUM 1 % TD GEL: 2.0000 g | Freq: Four times a day (QID) | TRANSDERMAL | 0 refills | Status: DC

## 2017-10-25 NOTE — Patient Instructions (Signed)
Elbow and Forearm Exercises Ask your health care provider which exercises are safe for you. Do exercises exactly as told by your health care provider and adjust them as directed. It is normal to feel mild stretching, pulling, tightness, or discomfort as you do these exercises, but you should stop right away if you feel sudden pain or your pain gets worse.Do not begin these exercises until told by your health care provider. RANGE OF MOTION EXERCISES These exercises warm up your muscles and joints and improve the movement and flexibility of your injured elbow and forearm. These exercises also help to relieve pain, numbness, and tingling.These exercises are done using the muscles in your injured elbow and forearm. Exercise A: Elbow Flexion, Active 1. Hold your left / right arm at your side, and bend your elbow as far as you can using your left / right arm muscles. 2. Hold this position for __________ seconds. 3. Slowly return to the starting position. Repeat __________ times. Complete this exercise __________ times a day. Exercise B: Elbow Extension, Active 1. Hold your left / right arm at your side, and straighten your elbow as much as you can using your left / right arm muscles. 2. Hold this position for __________ seconds. 3. Slowly return to the starting position. Repeat __________ times. Complete this exercise __________ times a day. Exercise C: Forearm Rotation, Supination, Active 1. Stand or sit with your elbows at your sides. 2. Bend your left / right elbow to an "L" shape (90 degrees). 3. Turn your palm upward until you feel a gentle stretch on the inside of your forearm. 4. Hold this position for __________ seconds. 5. Slowly release and return to the starting position. Repeat __________ times. Complete this exercise __________ times a day. Exercise D: Forearm Rotation, Pronation, Active 1. Stand or sit with your elbows at your side. 2. Bend your left / right elbow to an "L" shape (90  degrees). 3. Turn your left / right palm downward until you feel a gentle stretch on the top of your forearm. 4. Hold this position for __________ seconds. 5. Slowly release and return to the starting position. Repeat__________ times. Complete this exercise __________ times a day. STRETCHING EXERCISES These exercises warm up your muscles and joints and improve the movement and flexibility of your injured elbow and forearm. These exercises also help to relieve pain, numbness, and tingling.These exercises are done using your healthy elbow and forearm to help stretch the muscles in your injured elbow and forearm. Exercise E: Elbow Flexion, Active-Assisted  1. Hold your left / right arm at your side, and bend your elbow as much as you can using your left / right arm muscles. 2. Use your other hand to bend your left / right elbow farther. To do this, gently push up on your forearm until you feel a gentle stretch on the back of your elbow. 3. Hold this position for __________ seconds. 4. Slowly return to the starting position. Repeat __________ times. Complete this exercise __________ times a day. Exercise F: Elbow Extension, Active-Assisted  1. Hold your left / right arm at your side, and straighten your elbow as much as you can using your left / right arm muscles. 2. Use your other hand to straighten the left / right elbow farther. To do this, gently push down on your forearm until you feel a gentle stretch on the inside of your elbow. 3. Hold this position for __________ seconds. 4. Slowly return to the starting position. Repeat __________   times. Complete this exercise __________ times a day. Exercise G: Forearm Rotation, Supination, Active-Assisted  1. Sit with your left / right elbow bent in an "L" shape (90 degrees) with your forearm resting on a table. 2. Keeping your upper body and shoulder still, rotate your forearm so your left / right palm faces upward. 3. Use your other hand to help  rotate your forearm further until you feel a gentle to moderate stretch. 4. Hold this position for __________ seconds. 5. Slowly release the stretch and return to the starting position. Repeat __________ times. Complete this exercise __________ times a day. Exercise H: Forearm Rotation, Pronation, Active-Assisted  1. Sit with your left / right elbow bent in an "L" shape (90 degrees) with your forearm resting on a table. 2. Keeping your upper body and shoulder still, rotate your forearm so your palm faces the tabletop. 3. Use your other hand to help rotate your forearm further until you feel a gentle to moderate stretch. 4. Hold this position for __________ seconds. 5. Slowly release the stretch and return to the starting position. Repeat __________ times. Complete this exercise __________ times a day. Exercise I: Elbow Flexion, Supine, Passive 1. Lie on your back. 2. Extend your left / right arm up in the air, bracing it with your other hand. 3. Let your left / right your hand slowly lower toward your shoulder, while your elbow stays pointed toward the ceiling. You should feel a gentle stretch along the back of your upper arm and elbow. 4. If instructed by your health care provider, you may increase the intensity of your stretch by adding a small wrist weight or hand weight. 5. Hold this position for __________ seconds. 6. Slowly return to the starting position. Repeat __________ times. Complete this exercise __________ times a day. Exercise J: Elbow Extension, Supine, Passive  1. Lie on your back. Make sure that you are in a comfortable position that lets you relax your arm muscles. 2. Place a folded towel under your left / right upper arm so your elbow and shoulder are at the same height. Straighten your left / right arm so your elbow does not rest on the bed or towel. 3. Let the weight of your hand stretch your elbow. Keep your arm and chest muscles relaxed. You should feel a stretch on  the inside of your elbow. 4. If told by your health care provider, you may increase the intensity of your stretch by adding a small wrist weight or hand weight. 5. Hold this position for__________ seconds. 6. Slowly release the stretch. Repeat __________ times. Complete this exercise __________ times a day. STRENGTHENING EXERCISES These exercises build strength and endurance in your elbow and forearm. Endurance is the ability to use your muscles for a long time, even after they get tired. Exercise K: Elbow Flexion, Isometric  1. Stand or sit up straight. 2. Bend your left / right elbow in an "L" shape (90 degrees) and turn your palm up so your forearm is at the height of your waist. 3. Place your other hand on top of your forearm. Gently push down as your left / right arm resists. Push as hard as you can with both arms without causing any pain or movement at your left / right elbow. 4. Hold this position for __________ seconds. 5. Slowly release the tension in both arms. Let your muscles relax completely before repeating. Repeat __________ times. Complete this exercise __________ times a day. Exercise L: Elbow Extensors, Isometric  1. Stand or sit up straight. 2. Place your left / right arm so your palm faces your abdomen and it is at the height of your waist. 3. Place your other hand on the underside of your forearm. Gently push up as your left / right arm resists. Push as hard as you can with both arms, without causing any pain or movement at your left / right elbow. 4. Hold this position for __________ seconds. 5. Slowly release the tension in both arms. Let your muscles relax completely before repeating. Repeat __________ times. Complete this exercise __________ times a day. Exercise M: Elbow Flexion With Forearm Palm Up  1. Sit upright on a firm chair without armrests, or stand. 2. Place your left / right arm at your side with your palm facing forward. 3. Holding a __________weight  or gripping a rubber exercise band or tubing, bend your elbow to bring your hand toward your shoulder. 4. Hold this position for __________ seconds. 5. Slowly return to the starting position. Repeat __________times. Complete this exercise __________times a day. Exercise N: Elbow Extension  1. Sit on a firm chair without armrests, or stand. 2. Keeping your upper arms at your sides, bring both hands up toward your left / right shoulder while you grip a rubber exercise band or tubing. Your left / right hand should be just below the other hand. 3. Straighten your left / right elbow. 4. Hold this position for __________ seconds. 5. Control the resistance of the band or tubing as your hand returns to your side. Repeat __________times. Complete this exercise __________times a day. Exercise O: Forearm Rotation, Supination  1. Sit with your left / right forearm supported on a table. Keep your elbow at waist height. 2. Rest your hand over the edge of the table with your palm facing down. 3. Gently hold a lightweight hammer. 4. Without moving your elbow, slowly rotate your forearm to turn your palm and hand upward to a "thumbs-up" position. 5. Hold this position for __________ seconds. 6. Slowly return to the starting position. Repeat __________times. Complete this exercise __________times a day. Exercise P: Forearm Rotation, Pronation  1. Sit with your left / right forearm supported on a table. Keep your elbow below shoulder height. 2. Rest your hand over the edge of the table with your palm facing up. 3. Gently hold a lightweight hammer. 4. Without moving your elbow, slowly rotate your forearm to turn your palm and hand upward to a "thumbs-up" position. 5. Hold this position for __________seconds. 6. Slowly return to the starting position. Repeat __________times. Complete this exercise __________times a day. This information is not intended to replace advice given to you by your health care  provider. Make sure you discuss any questions you have with your health care provider. Document Released: 04/21/2005 Document Revised: 10/16/2015 Document Reviewed: 03/02/2015 Elsevier Interactive Patient Education  2018 [employer].  Health Maintenance, Male A healthy lifestyle and preventive care is important for your health and wellness. Ask your health care provider about what schedule of regular examinations is right for you. What should I know about weight and diet? Eat a Healthy Diet  Eat plenty of vegetables, fruits, whole grains, low-fat dairy products, and lean protein.  Do not eat a lot of foods high in solid fats, added sugars, or salt.  Maintain a Healthy Weight Regular exercise can help you achieve or maintain a healthy weight. You should:  Do at least 150 minutes of exercise each week. The exercise should increase  your heart rate and make you sweat (moderate-intensity exercise).  Do strength-training exercises at least twice a week.  Watch Your Levels of Cholesterol and Blood Lipids  Have your blood tested for lipids and cholesterol every 5 years starting at 45 years of age. If you are at high risk for heart disease, you should start having your blood tested when you are 45 years old. You may need to have your cholesterol levels checked more often if: ? Your lipid or cholesterol levels are high. ? You are older than 45 years of age. ? You are at high risk for heart disease.  What should I know about cancer screening? Many types of cancers can be detected early and may often be prevented. Lung Cancer  You should be screened every year for lung cancer if: ? You are a current smoker who has smoked for at least 30 years. ? You are a former smoker who has quit within the past 15 years.  Talk to your health care provider about your screening options, when you should start screening, and how often you should be screened.  Colorectal Cancer  Routine colorectal cancer  screening usually begins at 45 years of age and should be repeated every 5-10 years until you are 45 years old. You may need to be screened more often if early forms of precancerous polyps or small growths are found. Your health care provider may recommend screening at an earlier age if you have risk factors for colon cancer.  Your health care provider may recommend using home test kits to check for hidden blood in the stool.  A small camera at the end of a tube can be used to examine your colon (sigmoidoscopy or colonoscopy). This checks for the earliest forms of colorectal cancer.  Prostate and Testicular Cancer  Depending on your age and overall health, your health care provider may do certain tests to screen for prostate and testicular cancer.  Talk to your health care provider about any symptoms or concerns you have about testicular or prostate cancer.  Skin Cancer  Check your skin from head to toe regularly.  Tell your health care provider about any new moles or changes in moles, especially if: ? There is a change in a mole's size, shape, or color. ? You have a mole that is larger than a pencil eraser.  Always use sunscreen. Apply sunscreen liberally and repeat throughout the day.  Protect yourself by wearing long sleeves, pants, a wide-brimmed hat, and sunglasses when outside.  What should I know about heart disease, diabetes, and high blood pressure?  If you are 77-28 years of age, have your blood pressure checked every 3-5 years. If you are 59 years of age or older, have your blood pressure checked every year. You should have your blood pressure measured twice-once when you are at a hospital or clinic, and once when you are not at a hospital or clinic. Record the average of the two measurements. To check your blood pressure when you are not at a hospital or clinic, you can use: ? An automated blood pressure machine at a pharmacy. ? A home blood pressure monitor.  Talk to your  health care provider about your target blood pressure.  If you are between 97-41 years old, ask your health care provider if you should take aspirin to prevent heart disease.  Have regular diabetes screenings by checking your fasting blood sugar level. ? If you are at a normal weight and have a  low risk for diabetes, have this test once every three years after the age of 44. ? If you are overweight and have a high risk for diabetes, consider being tested at a younger age or more often.  A one-time screening for abdominal aortic aneurysm (AAA) by ultrasound is recommended for men aged 65-75 years who are current or former smokers. What should I know about preventing infection? Hepatitis B If you have a higher risk for hepatitis B, you should be screened for this virus. Talk with your health care provider to find out if you are at risk for hepatitis B infection. Hepatitis C Blood testing is recommended for:  Everyone born from [birth year] through 1963.  Anyone with known risk factors for hepatitis C.  Sexually Transmitted Diseases (STDs)  You should be screened each year for STDs including gonorrhea and chlamydia if: ? You are sexually active and are younger than 45 years of age. ? You are older than 45 years of age and your health care provider tells you that you are at risk for this type of infection. ? Your sexual activity has changed since you were last screened and you are at an increased risk for chlamydia or gonorrhea. Ask your health care provider if you are at risk.  Talk with your health care provider about whether you are at high risk of being infected with HIV. Your health care provider may recommend a prescription medicine to help prevent HIV infection.  What else can I do?  Schedule regular health, dental, and eye exams.  Stay current with your vaccines (immunizations).  Do not use any tobacco products, such as cigarettes, chewing tobacco, and e-cigarettes. If you need help  quitting, ask your health care provider.  Limit alcohol intake to no more than 2 drinks per day. One drink equals 12 ounces of beer, 5 ounces of wine, or 1 ounces of hard liquor.  Do not use street drugs.  Do not share needles.  Ask your health care provider for help if you need support or information about quitting drugs.  Tell your health care provider if you often feel depressed.  Tell your health care provider if you have ever been abused or do not feel safe at home. This information is not intended to replace advice given to you by your health care provider. Make sure you discuss any questions you have with your health care provider. Document Released: 12/04/2007 Document Revised: 02/04/2016 Document Reviewed: 03/11/2015 Elsevier Interactive Patient Education  2018 [employer].  Tennis Elbow Tennis elbow (lateral epicondylitis) is inflammation of the outer tendons of your forearm close to your elbow. Your tendons attach your muscles to your bones. The outer tendons of your forearm are used to extend your wrist, and they attach on the outside part of your elbow. Tennis elbow is often found in people who play tennis, but anyone may get the condition from repeatedly extending the wrist or turning the forearm. What are the causes? This condition is caused by repeatedly extending your wrist and using your hands. It can result from sports or work that requires repetitive forearm movements. Tennis elbow may also be caused by an injury. What increases the risk? You have a higher risk of developing tennis elbow if you play tennis or another racquet sport. You also have a higher risk if you frequently use your hands for work. This condition is also more likely to develop in:  Musicians.  Carpenters, painters, and plumbers.  Cooks.  Cashiers.  People  who work in [employer].  Holiday representative workers.  Butchers.  People who use computers.  What are the signs or symptoms? Symptoms of  this condition include:  Pain and tenderness in your forearm and the outer part of your elbow. You may only feel the pain when you use your arm, or you may feel it even when you are not using your arm.  A burning feeling that runs from your elbow through your arm.  Weak grip in your hands.  How is this diagnosed? This condition may be diagnosed by medical history and physical exam. You may also have other tests, including:  X-rays.  MRI.  How is this treated? Your health care provider will recommend lifestyle adjustments, such as resting and icing your arm. Treatment may also include:  Medicines for inflammation. This may include shots of cortisone if your pain continues.  Physical therapy. This may include massage or exercises.  An elbow brace.  Surgery may eventually be recommended if your pain does not go away with treatment. Follow these instructions at home: Activity  Rest your elbow and wrist as directed by your health care provider. Try to avoid any activities that caused the problem until your health care provider says that you can do them again.  If a physical therapist teaches you exercises, do all of them as directed.  If you lift an object, lift it with your palm facing upward. This lowers the stress on your elbow. Lifestyle  If your tennis elbow is caused by sports, check your equipment and make sure that: ? You are using it correctly. ? It is the best fit for you.  If your tennis elbow is caused by work, take breaks frequently, if you are able. Talk with your manager about how to best perform tasks in a way that is safe. ? If your tennis elbow is caused by computer use, talk with your manager about any changes that can be made to your work environment. General instructions  If directed, apply ice to the painful area: ? Put ice in a plastic bag. ? Place a towel between your skin and the bag. ? Leave the ice on for 20 minutes, 2-3 times per day.  Take  medicines only as directed by your health care provider.  If you were given a brace, wear it as directed by your health care provider.  Keep all follow-up visits as directed by your health care provider. This is important. Contact a health care provider if:  Your pain does not get better with treatment.  Your pain gets worse.  You have numbness or weakness in your forearm, hand, or fingers. This information is not intended to replace advice given to you by your health care provider. Make sure you discuss any questions you have with your health care provider. Document Released: 06/07/2005 Document Revised: 02/05/2016 Document Reviewed: 06/03/2014 Elsevier Interactive Patient Education  [employer].

## 2017-10-25 NOTE — Progress Notes (Signed)
Subjective:  Patient ID: Chad Limbo., male    DOB: Dec 06, 1970  Age: 47 y.o. MRN: [id number]  CC: Establish Care   HPI Chad Ruiz. presents for multiple medical issues.  Chad Ruiz has been having a 2-year history of pain in his left inguinal area.  There is been no discharge or dysuria.  Chad Ruiz is that Chad Ruiz is in a stable relationship.  There is pain in the inguinal area sometimes with urination and defecation.  Chad Ruiz does feel an internal bulge with the Valsalva maneuver in the left lower quadrant of his abdomen.  Colonoscopy 4 years ago failed to show evidence for diverticular disease.  Chad Ruiz is status post hernia repair with mesh due to a work-related injury 3 years ago.  Chad Ruiz has been having pain in his right elbow.  Chad Ruiz has right hand dominant.  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  Chad Ruiz has been using a tennis elbow strap with some relief.  History Chick has a past medical history of History of chicken pox and Kidney calculi.   Chad Ruiz has a past surgical history that includes Tonsillectomy (1979); nose (4098-1191); Hand surgery; and Ankle surgery.   His family history includes Breast cancer in his maternal grandmother; Diabetes (age of onset: 53) in his brother; Heart disease (age of onset: 56) in his paternal grandmother; Hypertension in his mother; Leukemia in his paternal uncle; Lung cancer in his paternal uncle; Skin cancer in his father and paternal grandfather.Chad Ruiz reports that Chad Ruiz has never smoked. Chad Ruiz has never used smokeless tobacco. Chad Ruiz reports that Chad Ruiz does not drink alcohol or use drugs.  No outpatient medications prior to visit.   No facility-administered medications prior to visit.     ROS Review of Systems  Constitutional: Negative.   HENT: Negative.   Eyes: Negative for photophobia and visual disturbance.  Respiratory: Negative.   Cardiovascular: Negative.   Gastrointestinal: Positive for abdominal pain. Negative for abdominal distention, anal bleeding, blood in stool, nausea and vomiting.    Endocrine: Negative for polyphagia and polyuria.  Genitourinary: Positive for testicular pain. Negative for decreased urine volume, difficulty urinating, discharge, dysuria, hematuria, penile pain and urgency.  Musculoskeletal: Positive for arthralgias.  Skin: Negative for color change, pallor and wound.  Allergic/Immunologic: Negative for immunocompromised state.  Neurological: Negative for weakness and headaches.  Hematological: Does not bruise/bleed easily.  Psychiatric/Behavioral: Negative.     Objective:  BP 130/90   Pulse (!) 102   Ht  (1.727 m)   Wt 168 lb 8 oz (76.4 kg)   SpO2 97%   BMI 25.62 kg/m   Physical Exam  Constitutional: Chad Ruiz appears well-developed and well-nourished. No distress.  HENT:  Head: Normocephalic and atraumatic.  Right Ear: External ear normal.  Left Ear: External ear normal.  Nose: Nose normal.  Mouth/Throat: Oropharynx is clear and moist. No oropharyngeal exudate.  Eyes: Pupils are equal, round, and reactive to light. Conjunctivae and EOM are normal. Right eye exhibits no discharge. Left eye exhibits no discharge. No scleral icterus.  Neck: Normal range of motion. Neck supple. No JVD present. No tracheal deviation present. No thyromegaly present.  Cardiovascular: Normal rate, regular rhythm and normal heart sounds.  Pulmonary/Chest: Effort normal and breath sounds normal. No stridor. No respiratory distress. Chad Ruiz has no wheezes. Chad Ruiz has no rales.  Abdominal: Soft. Bowel sounds are normal. Chad Ruiz exhibits no distension and no mass. There is tenderness. There is no rebound and no guarding. No hernia. Hernia confirmed negative in the right inguinal  area and confirmed negative in the left inguinal area.  Genitourinary: Rectum normal, prostate normal, testes normal and penis normal. Rectal exam shows no external hemorrhoid, no internal hemorrhoid, no fissure, no mass, no tenderness, anal tone normal and guaiac negative stool. Prostate is not enlarged and not  tender. Right testis shows no mass, no swelling and no tenderness. Right testis is descended. Left testis shows no mass, no swelling and no tenderness. Left testis is descended. Circumcised. No hypospadias, penile erythema or penile tenderness. No discharge found.  Lymphadenopathy:    Chad Ruiz has no cervical adenopathy. No inguinal adenopathy noted on the right or left side.  Skin: Chad Ruiz is not diaphoretic.      Assessment & Plan:   Chad Ruiz was seen today for establish care.  Diagnoses and all orders for this visit:  Lateral epicondylitis of right elbow -     diclofenac sodium (VOLTAREN) 1 % GEL; Apply 2 g topically 4 (four) times daily.  Inguinal pain, left -     CBC -     Comprehensive metabolic panel -     Urinalysis, Routine w reflex microscopic -     Ambulatory referral to General Surgery -     doxycycline (VIBRA-TABS) 100 MG tablet; Take 1 tablet (100 mg total) by mouth 2 (two) times daily.  Encounter for health maintenance examination with abnormal findings -     Comprehensive metabolic panel -     Lipid panel -     HIV antibody (with reflex)  Orchitis and epididymitis -     CBC -     Urinalysis, Routine w reflex microscopic -     Urine cytology ancillary only -     doxycycline (VIBRA-TABS) 100 MG tablet; Take 1 tablet (100 mg total) by mouth 2 (two) times daily.   I am having Chad Ruiz. start on diclofenac sodium and doxycycline.  Meds ordered this encounter  Medications  . diclofenac sodium (VOLTAREN) 1 % GEL    Sig: Apply 2 g topically 4 (four) times daily.    Dispense:  100 g    Refill:  0  . doxycycline (VIBRA-TABS) 100 MG tablet    Sig: Take 1 tablet (100 mg total) by mouth 2 (two) times daily.    Dispense:  20 tablet    Refill:  0   Patient will continue his elbow strap and begin Voltaren gel therapy.  Chad Ruiz will use this 2-3 times a day over the next few weeks and let me know if it does not improve.  Patient will follow-up with a general surgeon for  evaluation of his left lower quadrant and inguinal pain.  Colonoscopy 4 years ago was negative for diverticula.  Stool was heme negative today.  CBC is pending.  We will check for an STD etiology but this is unlikely with his history of stable relationship.  Follow-up: Return in about 1 month (around 11/25/2017).  Mliss Sax, MD

## 2017-10-26 LAB — HIV ANTIBODY (ROUTINE TESTING W REFLEX): HIV: NONREACTIVE

## 2017-10-26 LAB — URINE CYTOLOGY ANCILLARY ONLY
CHLAMYDIA, DNA PROBE: NEGATIVE
TRICH (WINDOWPATH): NEGATIVE

## 2017-10-28 ENCOUNTER — Ambulatory Visit: Payer: Self-pay | Admitting: Family Medicine

## 2017-11-07 ENCOUNTER — Telehealth: Payer: Self-pay | Admitting: Family Medicine

## 2017-11-07 NOTE — Telephone Encounter (Signed)
Copied from CRM [id number]. Topic: Quick Communication - See Telephone Encounter >> Nov 07, 2017  3:06 PM Oneal Grout wrote: CRM for notification. See Telephone encounter for: 11/07/17. Has finished pain meds, pain has come down from a level 8 to a 3 lower left stomach, does patient need to be seen again or can meds be called in again. [facility]. Please advise

## 2017-11-08 NOTE — Telephone Encounter (Signed)
Still think that it is a good idea to see the general surgeon.

## 2017-11-08 NOTE — Telephone Encounter (Signed)
Waiting for referral update before updating patient on message below.

## 2017-11-09 NOTE — Telephone Encounter (Signed)
I left patient a detailed message about Dr. Evangeline Gula response below. Patient should be getting a call to schedule his neurology appointment sometime today or tomorrow, his referral has been approved. Okay for triage nurse to go over info with patient.

## 2018-10-11 ENCOUNTER — Telehealth: Payer: Self-pay | Admitting: Family Medicine

## 2018-10-11 NOTE — Telephone Encounter (Signed)
I called and spoke to patient. Patient stated that he is doing fine and does not need to schedule a follow up appointment.

## 2019-10-02 ENCOUNTER — Emergency Department (HOSPITAL_COMMUNITY): Payer: No Typology Code available for payment source

## 2019-10-02 ENCOUNTER — Encounter (HOSPITAL_COMMUNITY): Payer: Self-pay

## 2019-10-02 ENCOUNTER — Emergency Department (HOSPITAL_COMMUNITY)
Admission: EM | Admit: 2019-10-02 | Discharge: 2019-10-02 | Disposition: A | Discharge status: Home / Self Care | Payer: No Typology Code available for payment source | Attending: Emergency Medicine | Admitting: Emergency Medicine

## 2019-10-02 ENCOUNTER — Other Ambulatory Visit: Payer: Self-pay

## 2019-10-02 DIAGNOSIS — S060X1A Concussion with loss of consciousness of 30 minutes or less, initial encounter: Secondary | ICD-10-CM | POA: Insufficient documentation

## 2019-10-02 DIAGNOSIS — S022XXA Fracture of nasal bones, initial encounter for closed fracture: Secondary | ICD-10-CM | POA: Diagnosis not present

## 2019-10-02 DIAGNOSIS — I726 Aneurysm of vertebral artery: Secondary | ICD-10-CM | POA: Insufficient documentation

## 2019-10-02 DIAGNOSIS — Y9389 Activity, other specified: Secondary | ICD-10-CM | POA: Insufficient documentation

## 2019-10-02 DIAGNOSIS — W208XXA Other cause of strike by thrown, projected or falling object, initial encounter: Secondary | ICD-10-CM | POA: Insufficient documentation

## 2019-10-02 DIAGNOSIS — Z23 Encounter for immunization: Secondary | ICD-10-CM | POA: Diagnosis not present

## 2019-10-02 DIAGNOSIS — Y9289 Other specified places as the place of occurrence of the external cause: Secondary | ICD-10-CM | POA: Diagnosis not present

## 2019-10-02 DIAGNOSIS — S0990XA Unspecified injury of head, initial encounter: Secondary | ICD-10-CM

## 2019-10-02 DIAGNOSIS — Y990 Civilian activity done for income or pay: Secondary | ICD-10-CM | POA: Diagnosis not present

## 2019-10-02 DIAGNOSIS — R519 Headache, unspecified: Secondary | ICD-10-CM | POA: Diagnosis present

## 2019-10-02 LAB — I-STAT CHEM 8, ED
BUN: 26 mg/dL — ABNORMAL HIGH (ref 6–20)
Calcium, Ion: 1.19 mmol/L (ref 1.15–1.40)
Chloride: 108 mmol/L (ref 98–111)
Creatinine, Ser: 1 mg/dL (ref 0.61–1.24)
Glucose, Bld: 99 mg/dL (ref 70–99)
HCT: 47 % (ref 39.0–52.0)
Hemoglobin: 16 g/dL (ref 13.0–17.0)
Potassium: 4.4 mmol/L (ref 3.5–5.1)
Sodium: 141 mmol/L (ref 135–145)
TCO2: 26 mmol/L (ref 22–32)

## 2019-10-02 LAB — CBC WITH DIFFERENTIAL/PLATELET
Abs Immature Granulocytes: 0.02 10*3/uL (ref 0.00–0.07)
Basophils Absolute: 0.1 10*3/uL (ref 0.0–0.1)
Basophils Relative: 2 %
Eosinophils Absolute: 0.3 10*3/uL (ref 0.0–0.5)
Eosinophils Relative: 4 %
HCT: 50.7 % (ref 39.0–52.0)
Hemoglobin: 16.3 g/dL (ref 13.0–17.0)
Immature Granulocytes: 0 %
Lymphocytes Relative: 25 %
Lymphs Abs: 1.7 10*3/uL (ref 0.7–4.0)
MCH: 28.9 pg (ref 26.0–34.0)
MCHC: 32.1 g/dL (ref 30.0–36.0)
MCV: 89.9 fL (ref 80.0–100.0)
Monocytes Absolute: 0.6 10*3/uL (ref 0.1–1.0)
Monocytes Relative: 8 %
Neutro Abs: 4.2 10*3/uL (ref 1.7–7.7)
Neutrophils Relative %: 61 %
Platelets: 232 10*3/uL (ref 150–400)
RBC: 5.64 MIL/uL (ref 4.22–5.81)
RDW: 12.7 % (ref 11.5–15.5)
WBC: 6.9 10*3/uL (ref 4.0–10.5)
nRBC: 0 % (ref 0.0–0.2)

## 2019-10-02 MED ORDER — TETANUS-DIPHTH-ACELL PERTUSSIS 5-2.5-18.5 LF-MCG/0.5 IM SUSP
0.5000 mL | Freq: Once | INTRAMUSCULAR | Status: AC
  Administered 2019-10-02: 0.5 mL via INTRAMUSCULAR
  Filled 2019-10-02: qty 0.5

## 2019-10-02 MED ORDER — IOHEXOL 350 MG/ML SOLN
80.0000 mL | Freq: Once | INTRAVENOUS | Status: AC | PRN
  Administered 2019-10-02: 80 mL via INTRAVENOUS

## 2019-10-02 MED ORDER — ONDANSETRON HCL 4 MG/2ML IJ SOLN
4.0000 mg | Freq: Once | INTRAMUSCULAR | Status: AC
  Administered 2019-10-02: 4 mg via INTRAVENOUS
  Filled 2019-10-02: qty 2

## 2019-10-02 MED ORDER — CYCLOBENZAPRINE HCL 10 MG PO TABS: 10.0000 mg | ORAL_TABLET | Freq: Two times a day (BID) | ORAL | 0 refills | Status: DC | PRN

## 2019-10-02 MED ORDER — MORPHINE SULFATE (PF) 4 MG/ML IV SOLN
4.0000 mg | Freq: Once | INTRAVENOUS | Status: AC
  Administered 2019-10-02: 4 mg via INTRAVENOUS
  Filled 2019-10-02: qty 1

## 2019-10-02 NOTE — ED Notes (Signed)
Pt returned from CT, nad noted 

## 2019-10-02 NOTE — ED Notes (Signed)
Pt to CT

## 2019-10-02 NOTE — Discharge Instructions (Addendum)
You have been seen today for head injury. Please read and follow all provided instructions. Return to the emergency room for worsening condition or new concerning symptoms.    The CT scan of your face showed you have broken bones on the left side of your nose. 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 No emergent interventions needed today.  1. Medications:  Please start taking 1 baby aspirin every day. -You can take tylenol if needed for pain -Prescription sent to your pharmacy for Flexeril.  This is a muscle relaxer.  It can make you drowsy so do not drive or work when taking it.  Continue usual home medications Take medications as prescribed. Please review all of the medicines and only take them if you do not have an allergy to them.   2. Treatment:  Here are guidelines for nasal fractures. Please follow them: -DO NOT blow your nose for at least two weeks. -DO NOT forcibly spit for one week. -DO NOT smoke or use smokeless tobacco; smoking greatly inhibits the healing process, especially in the sinuses. -Sneeze with your MOUTH OPEN. If the urge to sneeze arises, do not sneeze through your nose and avoid pinching nostrils. -Drink without a straw for one week. -Avoid swimming for one month and strenuous exercise (e.g. heavy lifting) for one week. -Gentle swishing with salt water may be done for one week, but do not rinse vigorously.  3. Follow Up:  Please follow up [facility] ear nose and throat for your broken nose.  When you call you should say this is an emergency department follow-up visit. -He is also follow-up with Dr. Conchita Paris the neurosurgeon to talk about further treatment of your aneurysm.    It is also a possibility that you have an allergic reaction to any of the medicines that you have been prescribed - Everybody reacts differently to medications and while  MOST people have no trouble with most medicines, you may have a reaction such as nausea, vomiting, rash, swelling, shortness of breath. If this is the case, please stop taking the medicine immediately and contact your physician.  ?

## 2019-10-02 NOTE — ED Triage Notes (Signed)
Pt from work with ems, a 3oo lb metal freight fell on his head, +LOC. Pt c.o pain from his head down his neck to his shoulder blades. +PMS, GCS 15, VSS 20G L Wrist

## 2019-10-02 NOTE — ED Provider Notes (Addendum)
[facility] EMERGENCY DEPARTMENT Provider Note   CSN: [id number] Arrival date & time:        History Chief Complaint  Patient presents with  . Head Injury    Chad Ruiz. is a 49 y.o. male presents to emergency department today via EMS with chief complaint of head injury happening just prior to arrival.  Patient states he was at work when he was helping put up a 300 pound metal pole.  The forklift driver accidentally dropped the pole and it hit him in the head.  Patient states he fell to the ground and lost consciousness.  When he came to he had pain in his face and neck as well as a mild headache. He states pain is been constant and describes it as an aching and throbbing sensation.  His headache has progressively worsened since onset. He rates the pain 7 of 10 in severity. No medications for his symptoms prior to arrival. He denies fever, chills, visual changes, decreased hearing, tinnitus, chest pain, abdominal pain, back pain, numbness, tingling, decreased sensation in arms or legs.  Tetanus is not up to date. Patient is not anticoagulant.  History provided by patient with additional history obtained from chart review.      Past Medical History:  Diagnosis Date  . History of chicken pox    childhood  . Kidney calculi     Patient Active Problem List   Diagnosis Date Noted  . Lateral epicondylitis of right elbow 10/25/2017  . Encounter for health maintenance examination with abnormal findings 10/25/2017  . Orchitis and epididymitis 10/25/2017  . Abnormal liver enzymes 04/13/2012  . Inguinal pain, left 10/24/2011  . Pulmonary nodule 10/24/2011    Past Surgical History:  Procedure Laterality Date  . ANKLE SURGERY    . HAND SURGERY    . nose  1996-1998   reconstruction x 3  . TONSILLECTOMY  1979       Family History  Problem Relation Age of Onset  . Hypertension Mother   . Skin cancer Father   . Breast cancer Maternal Grandmother   . Skin  cancer Paternal Grandfather   . Heart disease Paternal Grandmother 89       stent placement  . Diabetes Brother 48  . Lung cancer Paternal Uncle        smoker  . Leukemia Paternal Uncle   . Prostate cancer Neg Hx   . Colon cancer Neg Hx     Social History   Tobacco Use  . Smoking status: Never Smoker  . Smokeless tobacco: Never Used  Substance Use Topics  . Alcohol use: No  . Drug use: No    Home Medications Prior to Admission medications   Medication Sig Start Date End Date Taking? Authorizing Provider  cyclobenzaprine (FLEXERIL) 10 MG tablet Take 1 tablet (10 mg total) by mouth 2 (two) times daily as needed for muscle spasms. 10/02/19   Candice Tobey E, PA-C  diclofenac sodium (VOLTAREN) 1 % GEL Apply 2 g topically 4 (four) times daily. 10/25/17   Mliss Sax, MD  doxycycline (VIBRA-TABS) 100 MG tablet Take 1 tablet (100 mg total) by mouth 2 (two) times daily. 10/25/17   Mliss Sax, MD    Allergies    Patient has no known allergies.  Review of Systems   Review of Systems  All other systems are reviewed and are negative for acute change except as noted in the HPI.   Physical Exam Updated  Vital Signs BP (!) 142/102 (BP Location: Left Arm)   Pulse 75   Temp 98 F (36.7 C) (Oral)   Resp 14   Ht 5\' 8"  (1.727 m)   Wt 81.2 kg   SpO2 97%   BMI 27.22 kg/m   Physical Exam Vitals and nursing note reviewed.  Constitutional:      Appearance: He is well-developed. He is not ill-appearing or toxic-appearing.     Interventions: Cervical collar in place.  HENT:     Head: Normocephalic. No raccoon eyes or Battle's sign.     Jaw: There is normal jaw occlusion.      Comments: No tenderness to palpation of skull. No deformities or crepitus noted.  Mild swelling of left cheek.    Right Ear: Hearing, tympanic membrane, ear canal and external ear normal. No hemotympanum.     Left Ear: Hearing, tympanic membrane, ear canal and external ear normal. No  hemotympanum.     Nose: Nasal deformity and nasal tenderness present.     Right Nostril: No septal hematoma or occlusion.     Left Nostril: No septal hematoma or occlusion.      Comments: Superficial abrasion as depicted in image above. Dry blood noted in bilateral nares. No active bleeding.  Eyes:     General: No scleral icterus.       Right eye: No discharge.        Left eye: No discharge.     Extraocular Movements: Extraocular movements intact.     Conjunctiva/sclera: Conjunctivae normal.     Pupils: Pupils are equal, round, and reactive to light.  Neck:     Vascular: No JVD.  Cardiovascular:     Rate and Rhythm: Normal rate and regular rhythm.     Pulses: Normal pulses.     Heart sounds: Normal heart sounds.  Pulmonary:     Effort: Pulmonary effort is normal.     Breath sounds: Normal breath sounds.  Abdominal:     General: There is no distension.  Musculoskeletal:        General: Normal range of motion.  Skin:    General: Skin is warm and dry.  Neurological:     Mental Status: He is oriented to person, place, and time.     GCS: GCS eye subscore is 4. GCS verbal subscore is 5. GCS motor subscore is 6.     Comments: Speech is clear and goal oriented, follows commands CN III-XII intact, no facial droop Normal strength in upper and lower extremities bilaterally including dorsiflexion and plantar flexion, strong and equal grip strength Sensation normal to light and sharp touch Moves extremities without ataxia, coordination intact    Psychiatric:        Behavior: Behavior normal.       ED Results / Procedures / Treatments   Labs (all labs ordered are listed, but only abnormal results are displayed) Labs Reviewed  I-STAT CHEM 8, ED - Abnormal; Notable for the following components:      Result Value   BUN 26 (*)    All other components within normal limits  CBC WITH DIFFERENTIAL/PLATELET    EKG None  Radiology CT Angio Head W or Wo Contrast  Result Date:  10/02/2019 CLINICAL DATA:  Incidental finding; vertebral artery aneurysm. EXAM: CT ANGIOGRAPHY HEAD AND NECK TECHNIQUE: Multidetector CT imaging of the head and neck was performed using the standard protocol during bolus administration of intravenous contrast. Multiplanar CT image reconstructions and MIPs were obtained to  evaluate the vascular anatomy. Carotid stenosis measurements (when applicable) are obtained utilizing NASCET criteria, using the distal internal carotid diameter as the denominator. CONTRAST:  27mL OMNIPAQUE IOHEXOL 350 MG/ML SOLN COMPARISON:  CT head/maxillofacial/cervical spine performed earlier the same day 10/02/2019 FINDINGS: CTA NECK FINDINGS Aortic arch: The visualized aortic arch is unremarkable. Standard aortic branching. No significant innominate or proximal subclavian artery stenosis. Right carotid system: CCA and ICA patent within the neck without stenosis. No significant atherosclerotic disease. The mid cervical ICA is tortuous. Left carotid system: CCA and ICA patent within the neck without stenosis. No significant atherosclerotic disease. Vertebral arteries: The right vertebral artery is developmentally diminutive, but patent throughout the neck. The dominant left vertebral artery is patent throughout the neck without significant stenosis. Skeleton: No acute bony abnormality or aggressive osseous lesion. Cervical spondylosis greatest at C6-C7. Other neck: No neck mass or cervical lymphadenopathy. Upper chest: No consolidation within the imaged lung apices. Review of the MIP images confirms the above findings CTA HEAD FINDINGS Anterior circulation: The intracranial internal carotid arteries are patent without significant stenosis. The M1 middle cerebral arteries are patent without significant stenosis. No M2 proximal branch occlusion or high-grade proximal stenosis is identified. The anterior cerebral arteries are patent without significant proximal stenosis. No anterior circulation  aneurysm is identified. Posterior circulation: The non dominant intracranial right vertebral artery is developmentally diminutive, but patent. The dominant intracranial left vertebral artery is patent without significant stenosis. 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 The basilar artery is patent without significant stenosis. The posterior cerebral arteries are patent without significant proximal stenosis. Posterior communicating arteries are present bilaterally. Venous sinuses: Within limitations of contrast timing, no convincing thrombus. Anatomic variants: None significant Review of the MIP images confirms the above findings IMPRESSION: CTA neck: The bilateral common carotid, internal carotid and vertebral arteries are patent within the neck without significant stenosis. No significant atherosclerotic disease within these vessels. CTA head: 1. 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 2. No intracranial large vessel occlusion or proximal high-grade arterial stenosis. Electronically Signed   By: Jackey Loge DO   On: 10/02/2019 10:53   CT Head Wo Contrast  Result Date: 10/02/2019 CLINICAL DATA:  Trauma, 300 pound metal pole/beam fell and struck patient in head, lacerations to forehead and nose, head heaviness EXAM: CT HEAD WITHOUT CONTRAST CT MAXILLOFACIAL WITHOUT CONTRAST CT CERVICAL SPINE WITHOUT CONTRAST TECHNIQUE: Multidetector CT imaging of the head, cervical spine, and maxillofacial structures were performed using the standard protocol without intravenous contrast. Multiplanar CT image reconstructions of the cervical spine and maxillofacial structures were also generated. Right side of face marked with BB. COMPARISON:  None FINDINGS: CT HEAD FINDINGS Brain: Normal ventricular morphology. No midline shift or  mass effect. Normal appearance of brain parenchyma. No intracranial hemorrhage, mass lesion, or evidence of acute infarction. No extra-axial fluid collections. Vascular: Fusiform aneurysmal dilatation of the distal LEFT vertebral artery adjacent to the brainstem, 9 x 9 x 9 mm in size. This aneurysm insert mild mass effect upon the LEFT lateral aspect of the brainstem. Remaining vascular structures unremarkable. No hyperdense vessels. Skull: Calvaria intact Other: N/A CT MAXILLOFACIAL FINDINGS Osseous: Osseous mineralization normal. TMJ alignment normal bilaterally. Orbits, maxillary sinuses, and zygomas intact. Minimally depressed at LEFT nasal bone  fracture with an additional nondisplaced nasal bone fracture plane more superiorly. RIGHT nasal bone intact. Nasal septum midline. No additional facial fractures identified. Orbits: Bony orbits intact. Intraorbital soft tissue planes clear without fluid or pneumatosis Sinuses: Mild mucosal thickening in maxillary sinuses and anterior ethmoid air cells. Remaining paranasal sinuses, mastoid air cells and middle ear cavities clear Soft tissues: Soft tissue swelling at nose extending to upper lip. CT CERVICAL SPINE FINDINGS Alignment: Normal Skull base and vertebrae: Osseous mineralization normal. Disc space narrowing with endplate spur formation at C6-C7. Vertebral body and disc space heights otherwise maintained. No fracture, subluxation or bone destruction. Visualized skull base intact. Soft tissues and spinal canal: Prevertebral soft tissues normal thickness. Remaining cervical soft tissues unremarkable. Disc levels:  No specific abnormalities Upper chest: Lung apices clear Other: N/A IMPRESSION: No acute intracranial abnormalities. 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 LEFT nasal bone fractures. No additional facial  bone abnormalities. Degenerative disc disease changes C6-C7. No acute cervical spine abnormalities. Findings called to Henderson Baltimore PA in ED on fourth 1821 at 0906 hours. Electronically Signed   By: Ulyses Southward M.D.   On: 10/02/2019 09:08   CT Angio Neck W and/or Wo Contrast  Result Date: 10/02/2019 CLINICAL DATA:  Incidental finding; vertebral artery aneurysm. EXAM: CT ANGIOGRAPHY HEAD AND NECK TECHNIQUE: Multidetector CT imaging of the head and neck was performed using the standard protocol during bolus administration of intravenous contrast. Multiplanar CT image reconstructions and MIPs were obtained to evaluate the vascular anatomy. Carotid stenosis measurements (when applicable) are obtained utilizing NASCET criteria, using the distal internal carotid diameter as the denominator. CONTRAST:  17mL OMNIPAQUE IOHEXOL 350 MG/ML SOLN COMPARISON:  CT head/maxillofacial/cervical spine performed earlier the same day 10/02/2019 FINDINGS: CTA NECK FINDINGS Aortic arch: The visualized aortic arch is unremarkable. Standard aortic branching. No significant innominate or proximal subclavian artery stenosis. Right carotid system: CCA and ICA patent within the neck without stenosis. No significant atherosclerotic disease. The mid cervical ICA is tortuous. Left carotid system: CCA and ICA patent within the neck without stenosis. No significant atherosclerotic disease. Vertebral arteries: The right vertebral artery is developmentally diminutive, but patent throughout the neck. The dominant left vertebral artery is patent throughout the neck without significant stenosis. Skeleton: No acute bony abnormality or aggressive osseous lesion. Cervical spondylosis greatest at C6-C7. Other neck: No neck mass or cervical lymphadenopathy. Upper chest: No consolidation within the imaged lung apices. Review of the MIP images confirms the above findings CTA HEAD FINDINGS Anterior circulation: The intracranial internal carotid arteries  are patent without significant stenosis. The M1 middle cerebral arteries are patent without significant stenosis. No M2 proximal branch occlusion or high-grade proximal stenosis is identified. The anterior cerebral arteries are patent without significant proximal stenosis. No anterior circulation aneurysm is identified. Posterior circulation: The non dominant intracranial right vertebral artery is developmentally diminutive, but patent. The dominant intracranial left vertebral artery is patent without significant stenosis. 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 The basilar artery is patent without significant stenosis. The posterior cerebral arteries are patent without significant proximal stenosis. Posterior communicating arteries are present bilaterally. Venous sinuses: Within limitations of contrast timing, no convincing thrombus. Anatomic  variants: None significant Review of the MIP images confirms the above findings IMPRESSION: CTA neck: The bilateral common carotid, internal carotid and vertebral arteries are patent within the neck without significant stenosis. No significant atherosclerotic disease within these vessels. CTA head: 1. 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 2. No intracranial large vessel occlusion or proximal high-grade arterial stenosis. Electronically Signed   By: Kellie Simmering DO   On: 10/02/2019 10:53   CT Cervical Spine Wo Contrast  Result Date: 10/02/2019 CLINICAL DATA:  Trauma, 300 pound metal pole/beam fell and struck patient in head, lacerations to forehead and nose, head heaviness EXAM: CT HEAD WITHOUT CONTRAST CT MAXILLOFACIAL WITHOUT CONTRAST CT CERVICAL SPINE WITHOUT CONTRAST TECHNIQUE: Multidetector CT imaging of the head, cervical spine, and maxillofacial structures were  performed using the standard protocol without intravenous contrast. Multiplanar CT image reconstructions of the cervical spine and maxillofacial structures were also generated. Right side of face marked with BB. COMPARISON:  None FINDINGS: CT HEAD FINDINGS Brain: Normal ventricular morphology. No midline shift or mass effect. Normal appearance of brain parenchyma. No intracranial hemorrhage, mass lesion, or evidence of acute infarction. No extra-axial fluid collections. Vascular: Fusiform aneurysmal dilatation of the distal LEFT vertebral artery adjacent to the brainstem, 9 x 9 x 9 mm in size. This aneurysm insert mild mass effect upon the LEFT lateral aspect of the brainstem. Remaining vascular structures unremarkable. No hyperdense vessels. Skull: Calvaria intact Other: N/A CT MAXILLOFACIAL FINDINGS Osseous: Osseous mineralization normal. TMJ alignment normal bilaterally. Orbits, maxillary sinuses, and zygomas intact. Minimally depressed at LEFT nasal bone fracture with an additional nondisplaced nasal bone fracture plane more superiorly. RIGHT nasal bone intact. Nasal septum midline. No additional facial fractures identified. Orbits: Bony orbits intact. Intraorbital soft tissue planes clear without fluid or pneumatosis Sinuses: Mild mucosal thickening in maxillary sinuses and anterior ethmoid air cells. Remaining paranasal sinuses, mastoid air cells and middle ear cavities clear Soft tissues: Soft tissue swelling at nose extending to upper lip. CT CERVICAL SPINE FINDINGS Alignment: Normal Skull base and vertebrae: Osseous mineralization normal. Disc space narrowing with endplate spur formation at C6-C7. Vertebral body and disc space heights otherwise maintained. No fracture, subluxation or bone destruction. Visualized skull base intact. Soft tissues and spinal canal: Prevertebral soft tissues normal thickness. Remaining cervical soft tissues unremarkable. Disc levels:  No specific abnormalities Upper chest: Lung  apices clear Other: N/A IMPRESSION: No acute intracranial abnormalities. 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 LEFT nasal bone fractures. No additional facial bone abnormalities. Degenerative disc disease changes C6-C7. No acute cervical spine abnormalities. Findings called to Narda Bonds PA in ED on fourth 1821 at 0906 hours. Electronically Signed   By: Lavonia Dana M.D.   On: 10/02/2019 09:08   CT Maxillofacial Wo Contrast  Result Date: 10/02/2019 CLINICAL DATA:  Trauma, 300 pound metal pole/beam fell and struck patient in head, lacerations to forehead and nose, head heaviness EXAM: CT HEAD WITHOUT CONTRAST CT MAXILLOFACIAL WITHOUT CONTRAST CT CERVICAL SPINE WITHOUT CONTRAST TECHNIQUE: Multidetector CT imaging of the head, cervical spine, and maxillofacial structures were performed using the standard protocol without intravenous contrast. Multiplanar CT image reconstructions of the cervical spine and maxillofacial structures were also generated. Right side of face marked with BB. COMPARISON:  None FINDINGS: CT HEAD FINDINGS Brain: Normal ventricular morphology.  No midline shift or mass effect. Normal appearance of brain parenchyma. No intracranial hemorrhage, mass lesion, or evidence of acute infarction. No extra-axial fluid collections. Vascular: Fusiform aneurysmal dilatation of the distal LEFT vertebral artery adjacent to the brainstem, 9 x 9 x 9 mm in size. This aneurysm insert mild mass effect upon the LEFT lateral aspect of the brainstem. Remaining vascular structures unremarkable. No hyperdense vessels. Skull: Calvaria intact Other: N/A CT MAXILLOFACIAL FINDINGS Osseous: Osseous mineralization normal. TMJ alignment normal bilaterally. Orbits, maxillary sinuses, and zygomas intact. Minimally depressed at LEFT nasal bone fracture with an additional  nondisplaced nasal bone fracture plane more superiorly. RIGHT nasal bone intact. Nasal septum midline. No additional facial fractures identified. Orbits: Bony orbits intact. Intraorbital soft tissue planes clear without fluid or pneumatosis Sinuses: Mild mucosal thickening in maxillary sinuses and anterior ethmoid air cells. Remaining paranasal sinuses, mastoid air cells and middle ear cavities clear Soft tissues: Soft tissue swelling at nose extending to upper lip. CT CERVICAL SPINE FINDINGS Alignment: Normal Skull base and vertebrae: Osseous mineralization normal. Disc space narrowing with endplate spur formation at C6-C7. Vertebral body and disc space heights otherwise maintained. No fracture, subluxation or bone destruction. Visualized skull base intact. Soft tissues and spinal canal: Prevertebral soft tissues normal thickness. Remaining cervical soft tissues unremarkable. Disc levels:  No specific abnormalities Upper chest: Lung apices clear Other: N/A IMPRESSION: No acute intracranial abnormalities. 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 LEFT nasal bone fractures. No additional facial bone abnormalities. Degenerative disc disease changes C6-C7. No acute cervical spine abnormalities. Findings called to Henderson BaltimoreKaitlyn Abrizze PA in ED on fourth 1821 at 0906 hours. Electronically Signed   By: Ulyses SouthwardMark  Boles M.D.   On: 10/02/2019 09:08    Procedures Procedures (including critical care time)  Medications Ordered in ED Medications  morphine 4 MG/ML injection 4 mg (4 mg Intravenous Given 10/02/19 0745)  ondansetron (ZOFRAN) injection 4 mg (4 mg Intravenous Given 10/02/19 0745)  Tdap (BOOSTRIX) injection 0.5 mL (0.5 mLs Intramuscular Given 10/02/19 0914)  iohexol (OMNIPAQUE) 350 MG/ML injection 80 mL (80 mLs Intravenous Contrast Given 10/02/19 1027)    ED Course    I have reviewed the triage vital signs and the nursing notes.  Pertinent labs & imaging results that were available during my care of the patient were reviewed by me and considered in my medical decision making (see chart for details).  Clinical Course as of Oct 02 1207  Tue Oct 02, 2019  [id number] Radiologist called to inform of incidental left vertebral aneurysm. Will order CTA head and neck.Pain has improved after morphine, now 4/10. Patient declines need for more pain medication   [KA]    Clinical Course User Index [KA] Rosaisela Jamroz, Caroleen HammanKaitlyn E, PA-C   MDM Rules/Calculators/A&P                       Patient seen and examined. Patient presents awake, alert, hemodynamically stable, afebrile, non toxic. Patient arrives in cervical collar after head injury with headache, facial and neck pain. His nose has obvious deformity. No active bleeding, dry blood seen in bilateral nares. Mild swelling noted to left cheek.  He is able to move all extremities without signs of injury. Neuro exam without focal findings. No battle sign or racoon eyes.  Normal grip strength in bilateral upper and lower extremities.  Sensation  is intact to all extremities. Patient does have facial abrasions to forehead and bridge of nose not amendable to suture repair.  Wound care performed and tetanus updated.  I viewed patient's images.  CT head with incidental finding of left vertebral aneurysm measuring 9 x 9 x 9 mm.  CT maxillofacial shows left nasal bone fractures, no other traumatic findings. Cervical spine cleared and c collar removed.  ASIC labs obtained and are without significant findings.  No leukocytosis, no anemia, no severe electrolyte derangement, no renal insufficiency.  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 There is no intracranial large vessel occlusion or proximal high-grade arterial stenosis seen.  Consulted neurosurgeon on call  Dr. Dutch Quint who reviewed patient's imaging and is commended patient start taking daily baby aspirin and follow-up with his partner Dr. Conchita Paris in office. No emergent intervention is needed today.  Updated patient and significant other on results and recommendations. They are agreeable to plan. Also discussed nasal fracture and concussion precautions. Will discharge with prescription for muscle relaxer. Patient aware not to work or drive when taking as it can make him drowsy.  The patient appears reasonably screened and/or stabilized for discharge and I doubt any other medical condition or other [facility] requiring further screening, evaluation, or treatment in the ED at this time prior to discharge. The patient is safe for discharge with strict return precautions discussed. Patient appears reliable for follow up. Findings and plan of care discussed with supervising physician Dr. Adela Lank who agrees with plan of care.   Portions of this note were generated with Scientist, clinical (histocompatibility and immunogenetics). Dictation errors may occur despite best attempts at proofreading.  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

## 2019-10-11 ENCOUNTER — Encounter (HOSPITAL_BASED_OUTPATIENT_CLINIC_OR_DEPARTMENT_OTHER): Payer: Self-pay | Admitting: Otolaryngology

## 2019-10-11 ENCOUNTER — Other Ambulatory Visit: Payer: Self-pay

## 2019-10-11 NOTE — H&P (Signed)
HPI:   Chad Ruiz is a 49 y.o. male who presents as a new Patient.   Referring Provider: Self, A Referral  Chief complaint: Nasal fracture.  HPI: 8 days ago involved in a workplace injury, hit in the head and face by a large metal beam. His nose is now deflected towards the right and he has difficulty breathing through his nose. He has suffered 2 nasal fractures over the years in the past and has had it repaired twice. Otherwise in pretty good health.  PMH/Meds/All/SocHx/FamHx/ROS:   History reviewed. No pertinent past medical history.  Past Surgical History:  Procedure Laterality Date  . ANKLE SURGERY  . HAND SURGERY  . HERNIA REPAIR  . NOSE SURGERY   No family history of bleeding disorders, wound healing problems or difficulty with anesthesia.   Social History   Socioeconomic History  . Marital status: Single  Spouse name: Not on file  . Number of children: Not on file  . Years of education: Not on file  . Highest education level: Not on file  Occupational History  . Not on file  Tobacco Use  . Smoking status: Never Smoker  . Smokeless tobacco: Never Used  Substance and Sexual Activity  . Alcohol use: No  . Drug use: No  . Sexual activity: Yes  Partners: Female  Other Topics Concern  . Not on file  Social History Narrative  . Not on file   Social Determinants of Health   Financial Resource Strain:  . Difficulty of Paying Living Expenses:  Food Insecurity:  . Worried About Charity fundraiser in the Last Year:  . Arboriculturist in the Last Year:  Transportation Needs:  . Film/video editor (Medical):  Marland Kitchen Lack of Transportation (Non-Medical):  Physical Activity:  . Days of Exercise per Week:  . Minutes of Exercise per Session:  Stress:  . Feeling of Stress :  Social Connections:  . Frequency of Communication with Friends and Family:  . Frequency of Social Gatherings with Friends and Family:  . Attends Religious Services:  . Active Member of  Clubs or Organizations:  . Attends Archivist Meetings:  Marland Kitchen Marital Status:   Current Outpatient Medications:  . aspirin 81 MG EC tablet *ANTIPLATELET*, Take by mouth daily., Disp: , Rfl:   A complete ROS was performed with pertinent positives/negatives noted in the HPI. The remainder of the ROS are negative.   Physical Exam:   BP (!) 160/114  Pulse 86  Temp 97.1 F (36.2 C)  Ht 1.727 m (5\' 8" )  Wt 79.4 kg (175 lb)  BMI 26.61 kg/m   General: Healthy and alert, in no distress, breathing easily. Normal affect. In a pleasant mood. Head: Normocephalic, atraumatic. No masses, or scars. Eyes: Pupils are equal, and reactive to light. Vision is grossly intact. No spontaneous or gaze nystagmus. Ears: Ear canals are clear. Tympanic membranes are intact, with normal landmarks and the middle ears are clear and healthy. Hearing: Grossly normal. Nose: There is depression of the left nasal bone and lateralization of the right nasal bone with corresponding septal deviation towards the right and severe obstruction on both sides but worse on the left. Face: No masses or scars, facial nerve function is symmetric. Oral Cavity: No mucosal abnormalities are noted. Tongue with normal mobility. Dentition appears healthy. Oropharynx: Tonsils are symmetric. There are no mucosal masses identified. Tongue base appears normal and healthy. Larynx/Hypopharynx: deferred Chest: Deferred Neck: No palpable masses, no cervical adenopathy, no  thyroid nodules or enlargement. Neuro: Cranial nerves II-XII with normal function. Balance: Normal gate. Other findings: none.  Independent Review of Additional Tests or Records:  none  Procedures:  none  Impression & Plans:  Nasal fracture with obvious displacement. Recommend closed reduction under general anesthesia in the next week and a half at the most.

## 2019-10-11 NOTE — Progress Notes (Signed)
Left message with Victorino Dike at Dr. Lucky Rathke office requesting aspirin hold recommendations for surgery.

## 2019-10-13 ENCOUNTER — Other Ambulatory Visit (HOSPITAL_COMMUNITY): Payer: Self-pay

## 2019-10-15 ENCOUNTER — Other Ambulatory Visit (HOSPITAL_COMMUNITY)
Admission: RE | Admit: 2019-10-15 | Discharge: 2019-10-15 | Disposition: A | Discharge status: Home / Self Care | Payer: No Typology Code available for payment source | Source: Ambulatory Visit | Attending: Otolaryngology | Admitting: Otolaryngology

## 2019-10-15 DIAGNOSIS — S022XXA Fracture of nasal bones, initial encounter for closed fracture: Secondary | ICD-10-CM | POA: Diagnosis present

## 2019-10-15 DIAGNOSIS — Z7982 Long term (current) use of aspirin: Secondary | ICD-10-CM | POA: Diagnosis not present

## 2019-10-15 DIAGNOSIS — Y990 Civilian activity done for income or pay: Secondary | ICD-10-CM | POA: Diagnosis not present

## 2019-10-15 DIAGNOSIS — W228XXA Striking against or struck by other objects, initial encounter: Secondary | ICD-10-CM | POA: Diagnosis not present

## 2019-10-15 DIAGNOSIS — Z20822 Contact with and (suspected) exposure to covid-19: Secondary | ICD-10-CM | POA: Diagnosis not present

## 2019-10-15 LAB — SARS CORONAVIRUS 2 (TAT 6-24 HRS): SARS Coronavirus 2: NEGATIVE

## 2019-10-17 ENCOUNTER — Encounter (HOSPITAL_BASED_OUTPATIENT_CLINIC_OR_DEPARTMENT_OTHER): Payer: Self-pay | Admitting: Otolaryngology

## 2019-10-17 ENCOUNTER — Encounter (HOSPITAL_BASED_OUTPATIENT_CLINIC_OR_DEPARTMENT_OTHER)
Admission: RE | Disposition: A | Discharge status: Home / Self Care | Payer: Self-pay | Source: Home / Self Care | Attending: Otolaryngology

## 2019-10-17 ENCOUNTER — Ambulatory Visit (HOSPITAL_BASED_OUTPATIENT_CLINIC_OR_DEPARTMENT_OTHER)
Admission: RE | Admit: 2019-10-17 | Discharge: 2019-10-17 | Disposition: A | Discharge status: Home / Self Care | Payer: No Typology Code available for payment source | Attending: Otolaryngology | Admitting: Otolaryngology

## 2019-10-17 ENCOUNTER — Ambulatory Visit (HOSPITAL_BASED_OUTPATIENT_CLINIC_OR_DEPARTMENT_OTHER): Payer: No Typology Code available for payment source | Admitting: Anesthesiology

## 2019-10-17 ENCOUNTER — Other Ambulatory Visit: Payer: Self-pay

## 2019-10-17 ENCOUNTER — Other Ambulatory Visit: Payer: Self-pay | Admitting: Neurosurgery

## 2019-10-17 DIAGNOSIS — W228XXA Striking against or struck by other objects, initial encounter: Secondary | ICD-10-CM | POA: Insufficient documentation

## 2019-10-17 DIAGNOSIS — Z7982 Long term (current) use of aspirin: Secondary | ICD-10-CM | POA: Insufficient documentation

## 2019-10-17 DIAGNOSIS — S022XXA Fracture of nasal bones, initial encounter for closed fracture: Secondary | ICD-10-CM | POA: Insufficient documentation

## 2019-10-17 DIAGNOSIS — Z20822 Contact with and (suspected) exposure to covid-19: Secondary | ICD-10-CM | POA: Diagnosis not present

## 2019-10-17 DIAGNOSIS — Y990 Civilian activity done for income or pay: Secondary | ICD-10-CM | POA: Insufficient documentation

## 2019-10-17 DIAGNOSIS — I671 Cerebral aneurysm, nonruptured: Secondary | ICD-10-CM

## 2019-10-17 HISTORY — PX: CLOSED REDUCTION NASAL FRACTURE: SHX5365

## 2019-10-17 SURGERY — CLOSED REDUCTION, FRACTURE, NASAL BONE
Anesthesia: General

## 2019-10-17 MED ORDER — OXYCODONE HCL 5 MG PO TABS
ORAL_TABLET | ORAL | Status: AC
  Filled 2019-10-17: qty 1

## 2019-10-17 MED ORDER — PROPOFOL 10 MG/ML IV BOLUS
INTRAVENOUS | Status: AC
  Filled 2019-10-17: qty 20

## 2019-10-17 MED ORDER — ONDANSETRON HCL 4 MG/2ML IJ SOLN
INTRAMUSCULAR | Status: DC | PRN
  Administered 2019-10-17: 4 mg via INTRAVENOUS

## 2019-10-17 MED ORDER — LABETALOL HCL 5 MG/ML IV SOLN
5.0000 mg | INTRAVENOUS | Status: AC | PRN
  Administered 2019-10-17 (×2): 5 mg via INTRAVENOUS

## 2019-10-17 MED ORDER — FENTANYL CITRATE (PF) 100 MCG/2ML IJ SOLN
INTRAMUSCULAR | Status: AC
  Filled 2019-10-17: qty 2

## 2019-10-17 MED ORDER — LIDOCAINE-EPINEPHRINE 1 %-1:100000 IJ SOLN
INTRAMUSCULAR | Status: DC | PRN
  Administered 2019-10-17: 4 mL

## 2019-10-17 MED ORDER — MIDAZOLAM HCL 5 MG/5ML IJ SOLN
INTRAMUSCULAR | Status: DC | PRN
  Administered 2019-10-17: 2 mg via INTRAVENOUS

## 2019-10-17 MED ORDER — LABETALOL HCL 5 MG/ML IV SOLN
INTRAVENOUS | Status: AC
  Filled 2019-10-17: qty 4

## 2019-10-17 MED ORDER — OXYMETAZOLINE HCL 0.05 % NA SOLN
NASAL | Status: DC | PRN
  Administered 2019-10-17: 1 via TOPICAL

## 2019-10-17 MED ORDER — LIDOCAINE HCL (CARDIAC) PF 100 MG/5ML IV SOSY
PREFILLED_SYRINGE | INTRAVENOUS | Status: DC | PRN
  Administered 2019-10-17: 100 mg via INTRAVENOUS

## 2019-10-17 MED ORDER — OXYCODONE HCL 5 MG PO TABS
5.0000 mg | ORAL_TABLET | Freq: Once | ORAL | Status: AC | PRN
  Administered 2019-10-17: 12:00:00 5 mg via ORAL

## 2019-10-17 MED ORDER — ONDANSETRON HCL 4 MG/2ML IJ SOLN
INTRAMUSCULAR | Status: AC
  Filled 2019-10-17: qty 6

## 2019-10-17 MED ORDER — OXYCODONE HCL 5 MG/5ML PO SOLN: 5.0000 mg | Freq: Once | ORAL | Status: AC | PRN

## 2019-10-17 MED ORDER — HYDROMORPHONE HCL 1 MG/ML IJ SOLN
0.5000 mg | INTRAMUSCULAR | Status: DC | PRN
  Administered 2019-10-17 (×2): 0.5 mg via INTRAVENOUS

## 2019-10-17 MED ORDER — HYDROMORPHONE HCL 1 MG/ML IJ SOLN
INTRAMUSCULAR | Status: AC
  Filled 2019-10-17: qty 0.5

## 2019-10-17 MED ORDER — DEXAMETHASONE SODIUM PHOSPHATE 10 MG/ML IJ SOLN
INTRAMUSCULAR | Status: AC
  Filled 2019-10-17: qty 1

## 2019-10-17 MED ORDER — FENTANYL CITRATE (PF) 100 MCG/2ML IJ SOLN
25.0000 ug | INTRAMUSCULAR | Status: DC | PRN
  Administered 2019-10-17: 50 ug via INTRAVENOUS
  Administered 2019-10-17 (×2): 25 ug via INTRAVENOUS

## 2019-10-17 MED ORDER — ONDANSETRON HCL 4 MG/2ML IJ SOLN: 4.0000 mg | Freq: Once | INTRAMUSCULAR | Status: DC | PRN

## 2019-10-17 MED ORDER — FENTANYL CITRATE (PF) 100 MCG/2ML IJ SOLN
INTRAMUSCULAR | Status: DC | PRN
  Administered 2019-10-17: 100 ug via INTRAVENOUS

## 2019-10-17 MED ORDER — EPHEDRINE 5 MG/ML INJ
INTRAVENOUS | Status: AC
  Filled 2019-10-17: qty 10

## 2019-10-17 MED ORDER — BACITRACIN ZINC 500 UNIT/GM EX OINT
TOPICAL_OINTMENT | CUTANEOUS | Status: DC | PRN
  Administered 2019-10-17: 1 via TOPICAL

## 2019-10-17 MED ORDER — MIDAZOLAM HCL 2 MG/2ML IJ SOLN
INTRAMUSCULAR | Status: AC
  Filled 2019-10-17: qty 2

## 2019-10-17 MED ORDER — LACTATED RINGERS IV SOLN: INTRAVENOUS | Status: DC

## 2019-10-17 MED ORDER — DEXAMETHASONE SODIUM PHOSPHATE 4 MG/ML IJ SOLN
INTRAMUSCULAR | Status: DC | PRN
  Administered 2019-10-17: 10 mg via INTRAVENOUS

## 2019-10-17 MED ORDER — SUCCINYLCHOLINE CHLORIDE 200 MG/10ML IV SOSY
PREFILLED_SYRINGE | INTRAVENOUS | Status: AC
  Filled 2019-10-17: qty 10

## 2019-10-17 MED ORDER — PROPOFOL 10 MG/ML IV BOLUS
INTRAVENOUS | Status: DC | PRN
  Administered 2019-10-17: 200 mg via INTRAVENOUS

## 2019-10-17 MED ORDER — SUCCINYLCHOLINE CHLORIDE 20 MG/ML IJ SOLN
INTRAMUSCULAR | Status: DC | PRN
  Administered 2019-10-17: 50 mg via INTRAVENOUS

## 2019-10-17 SURGICAL SUPPLY — 23 items
APL SKNCLS STERI-STRIP NONHPOA (GAUZE/BANDAGES/DRESSINGS) ×1
BENZOIN TINCTURE PRP APPL 2/3 (GAUZE/BANDAGES/DRESSINGS) ×3 IMPLANT
CLOSURE WOUND 1/2 X4 (GAUZE/BANDAGES/DRESSINGS) ×1
CNTNR URN SCR LID CUP LEK RST (MISCELLANEOUS) IMPLANT
CONT SPEC 4OZ STRL OR WHT (MISCELLANEOUS) ×6
COVER BACK TABLE 60X90IN (DRAPES) ×3 IMPLANT
DECANTER SPIKE VIAL GLASS SM (MISCELLANEOUS) ×2 IMPLANT
DRSG NASAL KENNEDY LMNT 8CM (GAUZE/BANDAGES/DRESSINGS) ×2 IMPLANT
DRSG NASOPORE 8CM (GAUZE/BANDAGES/DRESSINGS) ×2 IMPLANT
GLOVE ECLIPSE 7.5 STRL STRAW (GLOVE) ×3 IMPLANT
GOWN STRL REUS W/ TWL LRG LVL3 (GOWN DISPOSABLE) ×1 IMPLANT
GOWN STRL REUS W/TWL LRG LVL3 (GOWN DISPOSABLE) ×3
MARKER SKIN DUAL TIP RULER LAB (MISCELLANEOUS) ×2 IMPLANT
NDL PRECISIONGLIDE 27X1.5 (NEEDLE) ×1 IMPLANT
NEEDLE PRECISIONGLIDE 27X1.5 (NEEDLE) ×3 IMPLANT
PATTIES SURGICAL .5 X3 (DISPOSABLE) ×3 IMPLANT
SHEET MEDIUM DRAPE 40X70 STRL (DRAPES) ×3 IMPLANT
SPLINT NASAL THERMO PLAST (MISCELLANEOUS) ×3 IMPLANT
SPONGE GAUZE 2X2 8PLY STER LF (GAUZE/BANDAGES/DRESSINGS) ×1
SPONGE GAUZE 2X2 8PLY STRL LF (GAUZE/BANDAGES/DRESSINGS) ×2 IMPLANT
STRIP CLOSURE SKIN 1/2X4 (GAUZE/BANDAGES/DRESSINGS) ×1 IMPLANT
SYR CONTROL 10ML LL (SYRINGE) ×3 IMPLANT
TOWEL GREEN STERILE FF (TOWEL DISPOSABLE) ×3 IMPLANT

## 2019-10-17 NOTE — Interval H&P Note (Signed)
History and Physical Interval Note:  10/17/2019 9:09 AM  Chad Ruiz.  has presented today for surgery, with the diagnosis of NASAL FRACTURE.  The various methods of treatment have been discussed with the patient and family. After consideration of risks, benefits and other options for treatment, the patient has consented to  Procedure(s): CLOSED REDUCTION NASAL FRACTURE (N/A) as a surgical intervention.  The patient's history has been reviewed, patient examined, no change in status, stable for surgery.  I have reviewed the patient's chart and labs.  Questions were answered to the patient's satisfaction.     [name]

## 2019-10-17 NOTE — Transfer of Care (Signed)
Immediate Anesthesia Transfer of Care Note  Patient: Chad Ruiz.  Procedure(s) Performed: CLOSED REDUCTION NASAL FRACTURE (N/A )  Patient Location: PACU  Anesthesia Type:General  Level of Consciousness: awake  Airway & Oxygen Therapy: Patient Spontanous Breathing and Patient connected to face mask oxygen  Post-op Assessment: Report given to RN and Post -op Vital signs reviewed and stable  Post vital signs: Reviewed and stable  Last Vitals:  Vitals Value Taken Time  BP    Temp    Pulse    Resp    SpO2      Last Pain:  Vitals:   10/17/19 0749  TempSrc: Temporal  PainSc: 2       Patients Stated Pain Goal: 2 (10/17/19 0749)  Complications: No apparent anesthesia complications

## 2019-10-17 NOTE — Anesthesia Procedure Notes (Signed)
Procedure Name: Intubation Date/Time: 10/17/2019 9:30 AM Performed by: Longstreet Desanctis, CRNA Pre-anesthesia Checklist: Patient identified, Emergency Drugs available, Suction available, Patient being monitored and Timeout performed Patient Re-evaluated:Patient Re-evaluated prior to induction Oxygen Delivery Method: Circle system utilized Preoxygenation: Pre-oxygenation with 100% oxygen Induction Type: IV induction Ventilation: Mask ventilation without difficulty Laryngoscope Size: Miller and 3 Grade View: Grade III Tube type: Oral Tube size: 8.0 mm Number of attempts: 1 Airway Equipment and Method: Stylet and Oral airway Placement Confirmation: ETT inserted through vocal cords under direct vision,  positive ETCO2 and breath sounds checked- equal and bilateral Secured at: 22 cm Tube secured with: Tape Dental Injury: Teeth and Oropharynx as per pre-operative assessment

## 2019-10-17 NOTE — Discharge Instructions (Signed)
Okay to use Tylenol and/or Motrin as needed for pain.  There is absorbable packing in both nasal cavities.  This will not have to be removed.  If you accidentally blow it or sneeze it out do not worry about that.  It will stay in as long as it needs to.  The plastic splint on the outside will stay on as long as it can anywhere from 2-7 days.  Once it comes off the adhesive strips will peel off relatively easily.  Try to keep all of the splint and the dressing dry as much as possible.   Post Anesthesia Home Care Instructions  Activity: Get plenty of rest for the remainder of the day. A responsible individual must stay with you for 24 hours following the procedure.  For the next 24 hours, DO NOT: -Drive a car -Advertising copywriter -Drink alcoholic beverages -Take any medication unless instructed by your physician -Make any legal decisions or sign important papers.  Meals: Start with liquid foods such as gelatin or soup. Progress to regular foods as tolerated. Avoid greasy, spicy, heavy foods. If nausea and/or vomiting occur, drink only clear liquids until the nausea and/or vomiting subsides. Call your physician if vomiting continues.  Special Instructions/Symptoms: Your throat may feel dry or sore from the anesthesia or the breathing tube placed in your throat during surgery. If this causes discomfort, gargle with warm salt water. The discomfort should disappear within 24 hours.  If you had a scopolamine patch placed behind your ear for the management of post- operative nausea and/or vomiting:  1. The medication in the patch is effective for 72 hours, after which it should be removed.  Wrap patch in a tissue and discard in the trash. Wash hands thoroughly with soap and water. 2. You may remove the patch earlier than 72 hours if you experience unpleasant side effects which may include dry mouth, dizziness or visual disturbances. 3. Avoid touching the patch. Wash your hands with soap and water  after contact with the patch.

## 2019-10-17 NOTE — Anesthesia Preprocedure Evaluation (Signed)
Anesthesia Evaluation  Patient identified by MRN, date of birth, ID band Patient awake    Reviewed: Allergy & Precautions, NPO status , Patient's Chart, lab work & pertinent test results  Airway Mallampati: III  TM Distance: >3 FB Neck ROM: Full    Dental no notable dental hx. (+) Teeth Intact   Pulmonary neg pulmonary ROS,    Pulmonary exam normal breath sounds clear to auscultation       Cardiovascular negative cardio ROS Normal cardiovascular exam Rhythm:Regular Rate:Normal     Neuro/Psych negative neurological ROS  negative psych ROS   GI/Hepatic negative GI ROS, Neg liver ROS,   Endo/Other  negative endocrine ROS  Renal/GU Renal diseaseHx/o Renal Calculi  negative genitourinary   Musculoskeletal  (+) Arthritis , Nasal Fx   Abdominal   Peds  Hematology negative hematology ROS (+)   Anesthesia Other Findings   Reproductive/Obstetrics                             Anesthesia Physical Anesthesia Plan  ASA: II  Anesthesia Plan: General   Post-op Pain Management:    Induction: Intravenous  PONV Risk Score and Plan: 4 or greater and Midazolam, Dexamethasone, Ondansetron and Treatment may vary due to age or medical condition  Airway Management Planned: Oral ETT  Additional Equipment:   Intra-op Plan:   Post-operative Plan: Extubation in OR  Informed Consent: I have reviewed the patients History and Physical, chart, labs and discussed the procedure including the risks, benefits and alternatives for the proposed anesthesia with the patient or authorized representative who has indicated his/her understanding and acceptance.     Dental advisory given  Plan Discussed with: CRNA and Surgeon  Anesthesia Plan Comments:         Anesthesia Quick Evaluation

## 2019-10-17 NOTE — Op Note (Signed)
OPERATIVE REPORT  DATE OF SURGERY: 10/17/2019  PATIENT:  Chad Limbo.,  49 y.o. male  PRE-OPERATIVE DIAGNOSIS:  NASAL FRACTURE  POST-OPERATIVE DIAGNOSIS:  NASAL FRACTURE  PROCEDURE:  Procedure(s): CLOSED REDUCTION NASAL FRACTURE  SURGEON:  Susy Frizzle, MD  ASSISTANTS: None  ANESTHESIA:   General   EBL: 10 ml  DRAINS: None  LOCAL MEDICATIONS USED: 1% Xylocaine with epinephrine  SPECIMEN:  none  COUNTS:  Correct  PROCEDURE DETAILS: The patient was taken to the operating room and placed on the operating table in the supine position. Following induction of general endotracheal anesthesia, the nose was draped in a standard fashion.  Afrin spray was used preoperatively in the nasal cavities.  1% Xylocaine with epinephrine was infiltrated into the upper septum and the nasal bone mucosa bilaterally.  There was a significant depression of the left nasal bone with lateralization towards the right and an S-shaped deformity of the nasal septum with a corresponding fracture of the septum.  A butter knife elevator was used to elevate the left nasal bone reducing the fracture and with external manipulation medializing the nasal bones nicely.-Forceps was then used to elevate the cartilaginous septum and to reduce the septal fracture which open the airways bilaterally.  A large nasal pore was cut and 2 thin pieces were placed 1 in each upper nasal vault area to support the nasal bone and upper septal mucosa.  Blood was suctioned from the nasal cavities and pharynx.  The nasal dorsum was dressed with benzoin Steri-Strips and an Aquaplast splint.  The patient was awakened extubated and transferred to recovery in stable condition.    PATIENT DISPOSITION:  To PACU, stable

## 2019-10-17 NOTE — Anesthesia Postprocedure Evaluation (Signed)
Anesthesia Post Note  Patient: Chad Ruiz.  Procedure(s) Performed: CLOSED REDUCTION NASAL FRACTURE (N/A )     Patient location during evaluation: PACU Anesthesia Type: General Level of consciousness: awake and alert and oriented Pain management: pain level controlled Vital Signs Assessment: post-procedure vital signs reviewed and stable Respiratory status: spontaneous breathing, nonlabored ventilation and respiratory function stable Cardiovascular status: blood pressure returned to baseline and stable Postop Assessment: no apparent nausea or vomiting Anesthetic complications: no    Last Vitals:  Vitals:   10/17/19 1100 10/17/19 1104  BP: (!) 138/106 (!) 136/98  Pulse: 80 84  Resp: 15 19  Temp:    SpO2: 92% 94%    Last Pain:  Vitals:   10/17/19 1100  TempSrc:   PainSc: 6                  Konstantinos Cordoba A.

## 2019-10-18 ENCOUNTER — Encounter: Payer: Self-pay | Admitting: *Deleted

## 2019-11-06 ENCOUNTER — Other Ambulatory Visit: Payer: Managed Care, Other (non HMO)

## 2019-11-23 ENCOUNTER — Encounter (HOSPITAL_BASED_OUTPATIENT_CLINIC_OR_DEPARTMENT_OTHER): Payer: Self-pay | Admitting: Otolaryngology

## 2019-11-23 ENCOUNTER — Other Ambulatory Visit: Payer: Self-pay

## 2019-11-28 NOTE — H&P (Signed)
HPI:   Chad Ruiz is a 49 y.o. male who presents as a new Patient.   Referring Provider: Self, A Referral  Chief complaint: Nasal fracture.  HPI: 8 days ago involved in a workplace injury, hit in the head and face by a large metal beam. His nose is now deflected towards the right and he has difficulty breathing through his nose. He has suffered 2 nasal fractures over the years in the past and has had it repaired twice. Otherwise in pretty good health.  PMH/Meds/All/SocHx/FamHx/ROS:   History reviewed. No pertinent past medical history.  Past Surgical History:  Procedure Laterality Date  . ANKLE SURGERY  . HAND SURGERY  . HERNIA REPAIR  . NOSE SURGERY   No family history of bleeding disorders, wound healing problems or difficulty with anesthesia.   Social History   Socioeconomic History  . Marital status: Single  Spouse name: Not on file  . Number of children: Not on file  . Years of education: Not on file  . Highest education level: Not on file  Occupational History  . Not on file  Tobacco Use  . Smoking status: Never Smoker  . Smokeless tobacco: Never Used  Substance and Sexual Activity  . Alcohol use: No  . Drug use: No  . Sexual activity: Yes  Partners: Female  Other Topics Concern  . Not on file  Social History Narrative  . Not on file   Social Determinants of Health   Financial Resource Strain:  . Difficulty of Paying Living Expenses:  Food Insecurity:  . Worried About Programme researcher, broadcasting/film/video in the Last Year:  . Barista in the Last Year:  Transportation Needs:  . Freight forwarder (Medical):  Marland Kitchen Lack of Transportation (Non-Medical):  Physical Activity:  . Days of Exercise per Week:  . Minutes of Exercise per Session:  Stress:  . Feeling of Stress :  Social Connections:  . Frequency of Communication with Friends and Family:  . Frequency of Social Gatherings with Friends and Family:  . Attends Religious Services:  . Active Member of  Clubs or Organizations:  . Attends Banker Meetings:  Marland Kitchen Marital Status:   Current Outpatient Medications:  . aspirin 81 MG EC tablet *ANTIPLATELET*, Take by mouth daily., Disp: , Rfl:   A complete ROS was performed with pertinent positives/negatives noted in the HPI. The remainder of the ROS are negative.   Physical Exam:   BP (!) 160/114  Pulse 86  Temp 97.1 F (36.2 C)  Ht 1.727 m (5\' 8" )  Wt 79.4 kg (175 lb)  BMI 26.61 kg/m   General: Healthy and alert, in no distress, breathing easily. Normal affect. In a pleasant mood. Head: Normocephalic, atraumatic. No masses, or scars. Eyes: Pupils are equal, and reactive to light. Vision is grossly intact. No spontaneous or gaze nystagmus. Ears: Ear canals are clear. Tympanic membranes are intact, with normal landmarks and the middle ears are clear and healthy. Hearing: Grossly normal. Nose: There is depression of the left nasal bone and lateralization of the right nasal bone with corresponding septal deviation towards the right and severe obstruction on both sides but worse on the left. Face: No masses or scars, facial nerve function is symmetric. Oral Cavity: No mucosal abnormalities are noted. Tongue with normal mobility. Dentition appears healthy. Oropharynx: Tonsils are symmetric. There are no mucosal masses identified. Tongue base appears normal and healthy. Larynx/Hypopharynx: deferred Chest: Deferred Neck: No palpable masses, no cervical adenopathy, no  thyroid nodules or enlargement. Neuro: Cranial nerves II-XII with normal function. Balance: Normal gate. Other findings: none.  Independent Review of Additional Tests or Records:  none  Procedures:  none  Impression & Plans:  Nasal fracture with obvious displacement. Recommend closed reduction under general anesthesia in the next week and a half at the most.    

## 2019-11-29 ENCOUNTER — Other Ambulatory Visit (HOSPITAL_COMMUNITY)
Admission: RE | Admit: 2019-11-29 | Discharge: 2019-11-29 | Disposition: A | Discharge status: Home / Self Care | Payer: Managed Care, Other (non HMO) | Source: Ambulatory Visit | Attending: Otolaryngology | Admitting: Otolaryngology

## 2019-11-29 DIAGNOSIS — Z20822 Contact with and (suspected) exposure to covid-19: Secondary | ICD-10-CM | POA: Insufficient documentation

## 2019-11-29 DIAGNOSIS — Z01812 Encounter for preprocedural laboratory examination: Secondary | ICD-10-CM | POA: Insufficient documentation

## 2019-11-29 LAB — SARS CORONAVIRUS 2 (TAT 6-24 HRS): SARS Coronavirus 2: NEGATIVE

## 2019-12-03 ENCOUNTER — Ambulatory Visit (HOSPITAL_BASED_OUTPATIENT_CLINIC_OR_DEPARTMENT_OTHER)
Admission: RE | Admit: 2019-12-03 | Payer: No Typology Code available for payment source | Source: Home / Self Care | Admitting: Otolaryngology

## 2019-12-03 ENCOUNTER — Ambulatory Visit (HOSPITAL_BASED_OUTPATIENT_CLINIC_OR_DEPARTMENT_OTHER): Admit: 2019-12-03 | Payer: Managed Care, Other (non HMO) | Admitting: Otolaryngology

## 2019-12-03 ENCOUNTER — Encounter (HOSPITAL_BASED_OUTPATIENT_CLINIC_OR_DEPARTMENT_OTHER): Payer: Self-pay

## 2019-12-03 HISTORY — DX: Cerebral aneurysm, nonruptured: I67.1

## 2019-12-03 SURGERY — SEPTOPLASTY, NOSE
Anesthesia: General | Laterality: Bilateral

## 2019-12-07 ENCOUNTER — Other Ambulatory Visit: Payer: Self-pay | Admitting: Neurosurgery

## 2019-12-07 DIAGNOSIS — I671 Cerebral aneurysm, nonruptured: Secondary | ICD-10-CM

## 2019-12-11 ENCOUNTER — Other Ambulatory Visit (HOSPITAL_COMMUNITY)
Admission: RE | Admit: 2019-12-11 | Discharge: 2019-12-11 | Disposition: A | Discharge status: Home / Self Care | Payer: Managed Care, Other (non HMO) | Source: Ambulatory Visit | Attending: Neurosurgery | Admitting: Neurosurgery

## 2019-12-11 DIAGNOSIS — Z20822 Contact with and (suspected) exposure to covid-19: Secondary | ICD-10-CM | POA: Insufficient documentation

## 2019-12-11 DIAGNOSIS — Z01812 Encounter for preprocedural laboratory examination: Secondary | ICD-10-CM | POA: Insufficient documentation

## 2019-12-11 LAB — SARS CORONAVIRUS 2 (TAT 6-24 HRS): SARS Coronavirus 2: NEGATIVE

## 2019-12-13 NOTE — H&P (Signed)
Chief Complaint   Vertebral artery aneurysm  HPI   HPI: Chad Ruiz. is a 49 y.o. male who was found to have a left vertebral artery aneurysm after suffering an accident at work where a large steel frame fell on him.  Serial CTA neck have demonstrated persistent aneurysm with unchanged in size.  While the etiology remains unclear, given that size and his young age it was recommended he undergo further workup.  He presents today for diagnostic cerebral angiogram.  He is without any concerns.   He has been taking daily aspirin.  Patient Active Problem List   Diagnosis Date Noted  . Lateral epicondylitis of right elbow 10/25/2017  . Encounter for health maintenance examination with abnormal findings 10/25/2017  . Orchitis and epididymitis 10/25/2017  . Abnormal liver enzymes 04/13/2012  . Inguinal pain, left 10/24/2011  . Pulmonary nodule 10/24/2011    PMH: Past Medical History:  Diagnosis Date  . Brain aneurysm    takes aspirin per MD  . History of chicken pox    childhood  . Kidney calculi     PSH: Past Surgical History:  Procedure Laterality Date  . ANKLE SURGERY    . CLOSED REDUCTION NASAL FRACTURE N/A 10/17/2019   Procedure: CLOSED REDUCTION NASAL FRACTURE;  Surgeon: [name], MD;  Location: [facility];  Service: ENT;  Laterality: N/A;  . HAND SURGERY    . nose  1996-1998   reconstruction x 3  . TONSILLECTOMY  1979    (Not in a hospital admission)   SH: Social History   Tobacco Use  . Smoking status: Never Smoker  . Smokeless tobacco: Never Used  Substance Use Topics  . Alcohol use: No  . Drug use: No    MEDS: Prior to Admission medications   Medication Sig Start Date End Date Taking? Authorizing Provider  aspirin EC 81 MG tablet Take 81 mg by mouth daily.    [provider]    ALLERGY: No Known Allergies  Social History   Tobacco Use  . Smoking status: Never Smoker  . Smokeless tobacco: Never Used  Substance  Use Topics  . Alcohol use: No     Family History  Problem Relation Age of Onset  . Hypertension Mother   . Skin cancer Father   . Breast cancer Maternal Grandmother   . Skin cancer Paternal Grandfather   . Heart disease Paternal Grandmother 48       stent placement  . Diabetes Brother 99  . Lung cancer Paternal Uncle        smoker  . Leukemia Paternal Uncle   . Prostate cancer Neg Hx   . Colon cancer Neg Hx      ROS   ROS  Exam   There were no vitals filed for this visit. General appearance: WDWN, NAD Eyes: No scleral injection Cardiovascular: Regular rate and rhythm without murmurs, rubs, gallops. No edema or variciosities. Distal pulses normal. Pulmonary: Effort normal, non-labored breathing Musculoskeletal:     Muscle tone upper extremities: Normal    Muscle tone lower extremities: Normal    Motor exam: Upper Extremities Deltoid Bicep Tricep Grip  Right 5/5 5/5 5/5 5/5  Left 5/5 5/5 5/5 5/5   Lower Extremity IP Quad PF DF EHL  Right 5/5 5/5 5/5 5/5 5/5  Left 5/5 5/5 5/5 5/5 5/5   Neurological Mental Status:    - Patient is awake, alert, oriented to person, place, month, year, and situation    -  Patient is able to give a clear and coherent history.    - No signs of aphasia or neglect Cranial Nerves    - II: Visual Fields are full. PERRL    - III/IV/VI: EOMI without ptosis or diploplia.     - V: Facial sensation is grossly normal    - VII: Facial movement is symmetric.     - VIII: hearing is intact to voice    - X: Uvula elevates symmetrically    - XI: Shoulder shrug is symmetric.    - XII: tongue is midline without atrophy or fasciculations.  Sensory: Sensation grossly intact to LT Deep Tendon Reflexes    - 2+ and symmetric in the biceps and patellae.  Plantars   - Toes are downgoing bilaterally.  Cerebellar    - FNF and HKS are intact bilaterally   Results - Imaging/Labs   Results for orders placed or performed during the hospital encounter of  12/11/19 (from the past 48 hour(s))  SARS CORONAVIRUS 2 (TAT 6-24 HRS) Nasopharyngeal Nasopharyngeal Swab     Status: None   Collection Time: 12/11/19 10:03 AM   Specimen: Nasopharyngeal Swab  Result Value Ref Range   SARS Coronavirus 2 NEGATIVE NEGATIVE    Comment: (NOTE) SARS-CoV-2 target nucleic acids are NOT DETECTED.  The SARS-CoV-2 RNA is generally detectable in upper and lower respiratory specimens during the acute phase of infection. Negative results do not preclude SARS-CoV-2 infection, do not rule out co-infections with other pathogens, and should not be used as the sole basis for treatment or other patient management decisions. Negative results must be combined with clinical observations, patient history, and epidemiological information. The expected result is Negative.  Fact Sheet for Patients: SugarRoll.be  Fact Sheet for Healthcare Providers: https://www.woods-mathews.com/  This test is not yet approved or cleared by the [place] FDA and  has been authorized for detection and/or diagnosis of SARS-CoV-2 by FDA under an Emergency Use Authorization (EUA). This EUA will remain  in effect (meaning this test can be used) for the duration of the COVID-19 declaration under Se ction 564(b)(1) of the Act, 21 U.S.C. section 360bbb-3(b)(1), unless the authorization is terminated or revoked sooner.  Performed at [facility], [place] [address], [place], [place] [address]     No results found.  IMAGING: Follow-up CT angiogram dated 11/06/2019 was personally reviewed.  This again demonstrates an approximately 11 mm x 8 mm fusiform dilatation of the left V4 segment.  It appears largely unchanged in comparison to the prior CT angiogram.  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  Etiology remains unclear however given the size and his young age I think further workup with angiogram is warranted  at this point.  We will proceed with diagnostic cerebral angiogram for further workup and characterization.    Risks, benefits and alternatives were discussed at length in the office.  Patient stated understanding and wished to proceed. All questions today were answered.  Consuella Lose, MD [facility]

## 2019-12-14 ENCOUNTER — Ambulatory Visit (HOSPITAL_COMMUNITY)
Admission: RE | Admit: 2019-12-14 | Discharge: 2019-12-14 | Disposition: A | Discharge status: Home / Self Care | Payer: No Typology Code available for payment source | Source: Ambulatory Visit | Attending: Neurosurgery | Admitting: Neurosurgery

## 2019-12-14 ENCOUNTER — Other Ambulatory Visit: Payer: Self-pay | Admitting: Neurosurgery

## 2019-12-14 ENCOUNTER — Other Ambulatory Visit: Payer: Self-pay

## 2019-12-14 DIAGNOSIS — I726 Aneurysm of vertebral artery: Secondary | ICD-10-CM | POA: Diagnosis present

## 2019-12-14 DIAGNOSIS — Z7982 Long term (current) use of aspirin: Secondary | ICD-10-CM | POA: Insufficient documentation

## 2019-12-14 DIAGNOSIS — I671 Cerebral aneurysm, nonruptured: Secondary | ICD-10-CM

## 2019-12-14 HISTORY — PX: IR ANGIO VERTEBRAL SEL VERTEBRAL BILAT MOD SED: IMG5369

## 2019-12-14 HISTORY — PX: IR ANGIO INTRA EXTRACRAN SEL INTERNAL CAROTID BILAT MOD SED: IMG5363

## 2019-12-14 LAB — BASIC METABOLIC PANEL
Anion gap: 8 (ref 5–15)
BUN: 26 mg/dL — ABNORMAL HIGH (ref 6–20)
CO2: 23 mmol/L (ref 22–32)
Calcium: 8.6 mg/dL — ABNORMAL LOW (ref 8.9–10.3)
Chloride: 106 mmol/L (ref 98–111)
Creatinine, Ser: 1.11 mg/dL (ref 0.61–1.24)
GFR calc Af Amer: >60 mL/min (ref >60)
GFR calc non Af Amer: >60 mL/min (ref >60)
Glucose, Bld: 102 mg/dL — ABNORMAL HIGH (ref 70–99)
Potassium: 4.5 mmol/L (ref 3.5–5.1)
Sodium: 137 mmol/L (ref 135–145)

## 2019-12-14 LAB — CBC WITH DIFFERENTIAL/PLATELET
Abs Immature Granulocytes: 0.02 10*3/uL (ref 0.00–0.07)
Basophils Absolute: 0.1 10*3/uL (ref 0.0–0.1)
Basophils Relative: 1 %
Eosinophils Absolute: 0.3 10*3/uL (ref 0.0–0.5)
Eosinophils Relative: 4 %
HCT: 48.8 % (ref 39.0–52.0)
Hemoglobin: 15.8 g/dL (ref 13.0–17.0)
Immature Granulocytes: 0 %
Lymphocytes Relative: 32 %
Lymphs Abs: 2.2 10*3/uL (ref 0.7–4.0)
MCH: 28.8 pg (ref 26.0–34.0)
MCHC: 32.4 g/dL (ref 30.0–36.0)
MCV: 89.1 fL (ref 80.0–100.0)
Monocytes Absolute: 0.6 10*3/uL (ref 0.1–1.0)
Monocytes Relative: 9 %
Neutro Abs: 3.6 10*3/uL (ref 1.7–7.7)
Neutrophils Relative %: 54 %
Platelets: 233 10*3/uL (ref 150–400)
RBC: 5.48 MIL/uL (ref 4.22–5.81)
RDW: 12.5 % (ref 11.5–15.5)
WBC: 6.7 10*3/uL (ref 4.0–10.5)
nRBC: 0 % (ref 0.0–0.2)

## 2019-12-14 LAB — PROTIME-INR
INR: 0.9 (ref 0.8–1.2)
Prothrombin Time: 12.2 seconds (ref 11.4–15.2)

## 2019-12-14 LAB — APTT: aPTT: 30 seconds (ref 24–36)

## 2019-12-14 MED ORDER — CEFAZOLIN SODIUM-DEXTROSE 2-4 GM/100ML-% IV SOLN: 2.0000 g | INTRAVENOUS | Status: DC

## 2019-12-14 MED ORDER — HEPARIN SODIUM (PORCINE) 1000 UNIT/ML IJ SOLN
INTRAMUSCULAR | Status: AC
  Filled 2019-12-14: qty 1

## 2019-12-14 MED ORDER — HYDROCODONE-ACETAMINOPHEN 5-325 MG PO TABS: 1.0000 | ORAL_TABLET | ORAL | Status: DC | PRN

## 2019-12-14 MED ORDER — CHLORHEXIDINE GLUCONATE CLOTH 2 % EX PADS: 6.0000 | MEDICATED_PAD | Freq: Once | CUTANEOUS | Status: DC

## 2019-12-14 MED ORDER — MIDAZOLAM HCL 2 MG/2ML IJ SOLN
INTRAMUSCULAR | Status: AC | PRN
  Administered 2019-12-14: 1 mg via INTRAVENOUS

## 2019-12-14 MED ORDER — MIDAZOLAM HCL 2 MG/2ML IJ SOLN
INTRAMUSCULAR | Status: AC
  Filled 2019-12-14: qty 2

## 2019-12-14 MED ORDER — HEPARIN SODIUM (PORCINE) 1000 UNIT/ML IJ SOLN
INTRAMUSCULAR | Status: AC | PRN
  Administered 2019-12-14: 2000 [IU] via INTRAVENOUS

## 2019-12-14 MED ORDER — IOHEXOL 300 MG/ML  SOLN
100.0000 mL | Freq: Once | INTRAMUSCULAR | Status: AC | PRN
  Administered 2019-12-14: 60 mL via INTRA_ARTERIAL

## 2019-12-14 MED ORDER — LIDOCAINE HCL (PF) 1 % IJ SOLN
INTRAMUSCULAR | Status: AC | PRN
  Administered 2019-12-14: 5 mL

## 2019-12-14 MED ORDER — FENTANYL CITRATE (PF) 100 MCG/2ML IJ SOLN
INTRAMUSCULAR | Status: AC | PRN
  Administered 2019-12-14: 25 ug via INTRAVENOUS

## 2019-12-14 MED ORDER — SODIUM CHLORIDE 0.9 % IV SOLN: INTRAVENOUS | Status: DC

## 2019-12-14 MED ORDER — LIDOCAINE HCL 1 % IJ SOLN
INTRAMUSCULAR | Status: AC
  Filled 2019-12-14: qty 20

## 2019-12-14 MED ORDER — FENTANYL CITRATE (PF) 100 MCG/2ML IJ SOLN
INTRAMUSCULAR | Status: AC
  Filled 2019-12-14: qty 2

## 2019-12-14 NOTE — Discharge Instructions (Signed)

## 2019-12-14 NOTE — Brief Op Note (Signed)
  NEUROSURGERY BRIEF OPERATIVE  NOTE   PREOP DX: LVA aneurysm  POSTOP DX: Same  PROCEDURE: Diagnostic cerebral angiogram  SURGEON: Dr. Lisbeth Renshaw, MD  ANESTHESIA: IV Sedation with Local  EBL: Minimal  SPECIMENS: None  COMPLICATIONS: None  CONDITION: Stable to recovery  FINDINGS (Full report in [facility]): 1. Fusiform aneurysm of mid-V4 segment measuring 8.3x9.7x7.29mm

## 2020-01-08 NOTE — H&P (Signed)
HPI:   Chad Ruiz is a 49 y.o. male who presents as a new Patient.   Referring Provider: Self, A Referral  Chief complaint: Nasal fracture.  HPI: 8 days ago involved in a workplace injury, hit in the head and face by a large metal beam. His nose is now deflected towards the right and he has difficulty breathing through his nose. He has suffered 2 nasal fractures over the years in the past and has had it repaired twice. Otherwise in pretty good health.  PMH/Meds/All/SocHx/FamHx/ROS:   History reviewed. No pertinent past medical history.  Past Surgical History:  Procedure Laterality Date  . ANKLE SURGERY  . HAND SURGERY  . HERNIA REPAIR  . NOSE SURGERY   No family history of bleeding disorders, wound healing problems or difficulty with anesthesia.   Social History   Socioeconomic History  . Marital status: Single  Spouse name: Not on file  . Number of children: Not on file  . Years of education: Not on file  . Highest education level: Not on file  Occupational History  . Not on file  Tobacco Use  . Smoking status: Never Smoker  . Smokeless tobacco: Never Used  Substance and Sexual Activity  . Alcohol use: No  . Drug use: No  . Sexual activity: Yes  Partners: Female  Other Topics Concern  . Not on file  Social History Narrative  . Not on file   Social Determinants of Health   Financial Resource Strain:  . Difficulty of Paying Living Expenses:  Food Insecurity:  . Worried About Programme researcher, broadcasting/film/video in the Last Year:  . Barista in the Last Year:  Transportation Needs:  . Freight forwarder (Medical):  Marland Kitchen Lack of Transportation (Non-Medical):  Physical Activity:  . Days of Exercise per Week:  . Minutes of Exercise per Session:  Stress:  . Feeling of Stress :  Social Connections:  . Frequency of Communication with Friends and Family:  . Frequency of Social Gatherings with Friends and Family:  . Attends Religious Services:  . Active Member of  Clubs or Organizations:  . Attends Banker Meetings:  Marland Kitchen Marital Status:   Current Outpatient Medications:  . aspirin 81 MG EC tablet *ANTIPLATELET*, Take by mouth daily., Disp: , Rfl:   A complete ROS was performed with pertinent positives/negatives noted in the HPI. The remainder of the ROS are negative.   Physical Exam:   BP (!) 160/114  Pulse 86  Temp 97.1 F (36.2 C)  Ht 1.727 m (5\' 8" )  Wt 79.4 kg (175 lb)  BMI 26.61 kg/m   General: Healthy and alert, in no distress, breathing easily. Normal affect. In a pleasant mood. Head: Normocephalic, atraumatic. No masses, or scars. Eyes: Pupils are equal, and reactive to light. Vision is grossly intact. No spontaneous or gaze nystagmus. Ears: Ear canals are clear. Tympanic membranes are intact, with normal landmarks and the middle ears are clear and healthy. Hearing: Grossly normal. Nose: There is depression of the left nasal bone and lateralization of the right nasal bone with corresponding septal deviation towards the right and severe obstruction on both sides but worse on the left. Face: No masses or scars, facial nerve function is symmetric. Oral Cavity: No mucosal abnormalities are noted. Tongue with normal mobility. Dentition appears healthy. Oropharynx: Tonsils are symmetric. There are no mucosal masses identified. Tongue base appears normal and healthy. Larynx/Hypopharynx: deferred Chest: Deferred Neck: No palpable masses, no cervical adenopathy, no  thyroid nodules or enlargement. Neuro: Cranial nerves II-XII with normal function. Balance: Normal gate. Other findings: none.  Independent Review of Additional Tests or Records:  none  Procedures:  none  Impression & Plans:  Nasal fracture with persistent septal deviation following closed reduction.  Recommend nasal septoplasty.Marland Kitchen

## 2020-01-09 ENCOUNTER — Other Ambulatory Visit: Payer: Self-pay | Admitting: Neurosurgery

## 2020-01-09 ENCOUNTER — Other Ambulatory Visit (HOSPITAL_COMMUNITY): Payer: Self-pay | Admitting: Neurosurgery

## 2020-01-09 DIAGNOSIS — I671 Cerebral aneurysm, nonruptured: Secondary | ICD-10-CM

## 2020-01-16 ENCOUNTER — Other Ambulatory Visit: Payer: Self-pay

## 2020-01-16 ENCOUNTER — Encounter (HOSPITAL_BASED_OUTPATIENT_CLINIC_OR_DEPARTMENT_OTHER): Payer: Self-pay | Admitting: Otolaryngology

## 2020-01-21 ENCOUNTER — Other Ambulatory Visit (HOSPITAL_COMMUNITY)
Admission: RE | Admit: 2020-01-21 | Discharge: 2020-01-21 | Disposition: A | Discharge status: Home / Self Care | Payer: Managed Care, Other (non HMO) | Source: Ambulatory Visit | Attending: Otolaryngology | Admitting: Otolaryngology

## 2020-01-21 DIAGNOSIS — Z20822 Contact with and (suspected) exposure to covid-19: Secondary | ICD-10-CM | POA: Diagnosis not present

## 2020-01-21 DIAGNOSIS — Z01812 Encounter for preprocedural laboratory examination: Secondary | ICD-10-CM | POA: Diagnosis not present

## 2020-01-21 LAB — SARS CORONAVIRUS 2 (TAT 6-24 HRS): SARS Coronavirus 2: NEGATIVE

## 2020-01-22 NOTE — Progress Notes (Signed)
History of vertebral artery aneurysm  reviewed by Dr. Hyacinth Meeker and will proceed with surgery as scheduled.

## 2020-01-23 ENCOUNTER — Encounter (HOSPITAL_BASED_OUTPATIENT_CLINIC_OR_DEPARTMENT_OTHER): Payer: Self-pay | Admitting: Otolaryngology

## 2020-01-23 ENCOUNTER — Other Ambulatory Visit: Payer: Self-pay

## 2020-01-23 ENCOUNTER — Ambulatory Visit (HOSPITAL_BASED_OUTPATIENT_CLINIC_OR_DEPARTMENT_OTHER): Payer: Managed Care, Other (non HMO) | Admitting: Certified Registered Nurse Anesthetist

## 2020-01-23 ENCOUNTER — Encounter (HOSPITAL_BASED_OUTPATIENT_CLINIC_OR_DEPARTMENT_OTHER)
Admission: RE | Disposition: A | Discharge status: Home / Self Care | Payer: Self-pay | Source: Home / Self Care | Attending: Otolaryngology

## 2020-01-23 ENCOUNTER — Ambulatory Visit (HOSPITAL_BASED_OUTPATIENT_CLINIC_OR_DEPARTMENT_OTHER)
Admission: RE | Admit: 2020-01-23 | Discharge: 2020-01-23 | Disposition: A | Discharge status: Home / Self Care | Payer: Managed Care, Other (non HMO) | Attending: Otolaryngology | Admitting: Otolaryngology

## 2020-01-23 DIAGNOSIS — I671 Cerebral aneurysm, nonruptured: Secondary | ICD-10-CM | POA: Diagnosis not present

## 2020-01-23 DIAGNOSIS — M1990 Unspecified osteoarthritis, unspecified site: Secondary | ICD-10-CM | POA: Diagnosis not present

## 2020-01-23 DIAGNOSIS — S022XXA Fracture of nasal bones, initial encounter for closed fracture: Secondary | ICD-10-CM | POA: Insufficient documentation

## 2020-01-23 DIAGNOSIS — J342 Deviated nasal septum: Secondary | ICD-10-CM | POA: Insufficient documentation

## 2020-01-23 DIAGNOSIS — W228XXA Striking against or struck by other objects, initial encounter: Secondary | ICD-10-CM | POA: Diagnosis not present

## 2020-01-23 DIAGNOSIS — Y990 Civilian activity done for income or pay: Secondary | ICD-10-CM | POA: Diagnosis not present

## 2020-01-23 HISTORY — PX: SEPTOPLASTY: SHX2393

## 2020-01-23 SURGERY — SEPTOPLASTY, NOSE
Anesthesia: General | Site: Nose

## 2020-01-23 MED ORDER — SUGAMMADEX SODIUM 200 MG/2ML IV SOLN
INTRAVENOUS | Status: DC | PRN
  Administered 2020-01-23: 200 mg via INTRAVENOUS

## 2020-01-23 MED ORDER — FENTANYL CITRATE (PF) 100 MCG/2ML IJ SOLN
25.0000 ug | INTRAMUSCULAR | Status: DC | PRN
  Administered 2020-01-23: 25 ug via INTRAVENOUS
  Administered 2020-01-23: 50 ug via INTRAVENOUS

## 2020-01-23 MED ORDER — ONDANSETRON HCL 4 MG/2ML IJ SOLN
INTRAMUSCULAR | Status: AC
  Filled 2020-01-23: qty 2

## 2020-01-23 MED ORDER — OXYMETAZOLINE HCL 0.05 % NA SOLN
NASAL | Status: AC
  Filled 2020-01-23: qty 30

## 2020-01-23 MED ORDER — MIDAZOLAM HCL 2 MG/2ML IJ SOLN
INTRAMUSCULAR | Status: AC
  Filled 2020-01-23: qty 2

## 2020-01-23 MED ORDER — LIDOCAINE-EPINEPHRINE 1 %-1:100000 IJ SOLN
INTRAMUSCULAR | Status: DC | PRN
  Administered 2020-01-23: 3 mL

## 2020-01-23 MED ORDER — BUPIVACAINE HCL (PF) 0.25 % IJ SOLN
INTRAMUSCULAR | Status: AC
  Filled 2020-01-23: qty 120

## 2020-01-23 MED ORDER — LIDOCAINE-EPINEPHRINE 1 %-1:100000 IJ SOLN
INTRAMUSCULAR | Status: AC
  Filled 2020-01-23: qty 1

## 2020-01-23 MED ORDER — LIDOCAINE 2% (20 MG/ML) 5 ML SYRINGE
INTRAMUSCULAR | Status: AC
  Filled 2020-01-23: qty 5

## 2020-01-23 MED ORDER — HYDROCODONE-ACETAMINOPHEN 7.5-325 MG PO TABS: 1.0000 | ORAL_TABLET | Freq: Four times a day (QID) | ORAL | 0 refills | Status: DC | PRN

## 2020-01-23 MED ORDER — LIDOCAINE HCL (CARDIAC) PF 100 MG/5ML IV SOSY
PREFILLED_SYRINGE | INTRAVENOUS | Status: DC | PRN
  Administered 2020-01-23: 80 mg via INTRAVENOUS

## 2020-01-23 MED ORDER — MIDAZOLAM HCL 5 MG/5ML IJ SOLN
INTRAMUSCULAR | Status: DC | PRN
  Administered 2020-01-23: 2 mg via INTRAVENOUS

## 2020-01-23 MED ORDER — DEXAMETHASONE SODIUM PHOSPHATE 10 MG/ML IJ SOLN
INTRAMUSCULAR | Status: AC
  Filled 2020-01-23: qty 1

## 2020-01-23 MED ORDER — FENTANYL CITRATE (PF) 100 MCG/2ML IJ SOLN
INTRAMUSCULAR | Status: AC
  Filled 2020-01-23: qty 2

## 2020-01-23 MED ORDER — PROMETHAZINE HCL 25 MG RE SUPP
25.0000 mg | Freq: Four times a day (QID) | RECTAL | 1 refills | Status: DC | PRN
Start: 2020-01-23 — End: 2020-08-06

## 2020-01-23 MED ORDER — OXYCODONE HCL 5 MG PO TABS
ORAL_TABLET | ORAL | Status: AC
  Filled 2020-01-23: qty 1

## 2020-01-23 MED ORDER — ONDANSETRON HCL 4 MG/2ML IJ SOLN
INTRAMUSCULAR | Status: DC | PRN
  Administered 2020-01-23: 4 mg via INTRAVENOUS

## 2020-01-23 MED ORDER — DEXAMETHASONE SODIUM PHOSPHATE 10 MG/ML IJ SOLN
INTRAMUSCULAR | Status: DC | PRN
  Administered 2020-01-23: 10 mg via INTRAVENOUS

## 2020-01-23 MED ORDER — OXYCODONE HCL 5 MG PO TABS
5.0000 mg | ORAL_TABLET | Freq: Once | ORAL | Status: AC | PRN
  Administered 2020-01-23: 5 mg via ORAL

## 2020-01-23 MED ORDER — PROMETHAZINE HCL 25 MG/ML IJ SOLN: 6.2500 mg | INTRAMUSCULAR | Status: DC | PRN

## 2020-01-23 MED ORDER — OXYMETAZOLINE HCL 0.05 % NA SOLN
NASAL | Status: DC | PRN
  Administered 2020-01-23: 1

## 2020-01-23 MED ORDER — FENTANYL CITRATE (PF) 100 MCG/2ML IJ SOLN
INTRAMUSCULAR | Status: DC | PRN
  Administered 2020-01-23 (×2): 50 ug via INTRAVENOUS
  Administered 2020-01-23: 100 ug via INTRAVENOUS

## 2020-01-23 MED ORDER — ROCURONIUM BROMIDE 10 MG/ML (PF) SYRINGE
PREFILLED_SYRINGE | INTRAVENOUS | Status: AC
  Filled 2020-01-23: qty 10

## 2020-01-23 MED ORDER — ROCURONIUM BROMIDE 100 MG/10ML IV SOLN
INTRAVENOUS | Status: DC | PRN
  Administered 2020-01-23: 60 mg via INTRAVENOUS

## 2020-01-23 MED ORDER — OXYMETAZOLINE HCL 0.05 % NA SOLN
2.0000 | NASAL | Status: DC
  Administered 2020-01-23 (×2): 2 via NASAL

## 2020-01-23 MED ORDER — OXYCODONE HCL 5 MG/5ML PO SOLN: 5.0000 mg | Freq: Once | ORAL | Status: AC | PRN

## 2020-01-23 MED ORDER — PROPOFOL 10 MG/ML IV BOLUS
INTRAVENOUS | Status: AC
  Filled 2020-01-23: qty 20

## 2020-01-23 MED ORDER — PROPOFOL 10 MG/ML IV BOLUS
INTRAVENOUS | Status: DC | PRN
  Administered 2020-01-23: 200 mg via INTRAVENOUS

## 2020-01-23 MED ORDER — BACITRACIN ZINC 500 UNIT/GM EX OINT
TOPICAL_OINTMENT | CUTANEOUS | Status: DC | PRN
  Administered 2020-01-23: 1 via TOPICAL

## 2020-01-23 MED ORDER — BACITRACIN ZINC 500 UNIT/GM EX OINT
TOPICAL_OINTMENT | CUTANEOUS | Status: AC
  Filled 2020-01-23: qty 28.35

## 2020-01-23 MED ORDER — LACTATED RINGERS IV SOLN: INTRAVENOUS | Status: DC

## 2020-01-23 SURGICAL SUPPLY — 34 items
ATTRACTOMAT 16X20 MAGNETIC DRP (DRAPES) IMPLANT
BLADE SURG 11 STRL SS (BLADE) IMPLANT
CANISTER SUCT 1200ML W/VALVE (MISCELLANEOUS) ×3 IMPLANT
CATH ROBINSON RED A/P 8FR (CATHETERS) IMPLANT
CLOSURE WOUND 1/2 X4 (GAUZE/BANDAGES/DRESSINGS)
COVER WAND RF STERILE (DRAPES) IMPLANT
DECANTER SPIKE VIAL GLASS SM (MISCELLANEOUS) IMPLANT
DRSG TELFA 3X8 NADH (GAUZE/BANDAGES/DRESSINGS) ×3 IMPLANT
GAUZE 4X4 16PLY RFD (DISPOSABLE) IMPLANT
GLOVE BIO SURGEON STRL SZ7 (GLOVE) ×2 IMPLANT
GLOVE BIOGEL PI IND STRL 7.5 (GLOVE) IMPLANT
GLOVE BIOGEL PI INDICATOR 7.5 (GLOVE) ×2
GLOVE ECLIPSE 7.5 STRL STRAW (GLOVE) ×3 IMPLANT
GOWN STRL REUS W/ TWL LRG LVL3 (GOWN DISPOSABLE) ×2 IMPLANT
GOWN STRL REUS W/TWL LRG LVL3 (GOWN DISPOSABLE) ×6
HEMOSTAT SURGICEL .5X2 ABSORB (HEMOSTASIS) IMPLANT
NDL PRECISIONGLIDE 27X1.5 (NEEDLE) ×1 IMPLANT
NEEDLE PRECISIONGLIDE 27X1.5 (NEEDLE) ×3 IMPLANT
NS IRRIG 1000ML POUR BTL (IV SOLUTION) IMPLANT
PACK BASIN DAY SURGERY FS (CUSTOM PROCEDURE TRAY) ×3 IMPLANT
PACK ENT DAY SURGERY (CUSTOM PROCEDURE TRAY) ×3 IMPLANT
PAD DRESSING TELFA 3X8 NADH (GAUZE/BANDAGES/DRESSINGS) IMPLANT
PATTIES SURGICAL .5 X3 (DISPOSABLE) ×3 IMPLANT
SHEET SILICONE 2X3 0.03 REINF (MISCELLANEOUS) IMPLANT
SPONGE GAUZE 2X2 8PLY STER LF (GAUZE/BANDAGES/DRESSINGS) ×1
SPONGE GAUZE 2X2 8PLY STRL LF (GAUZE/BANDAGES/DRESSINGS) ×2 IMPLANT
STRIP CLOSURE SKIN 1/2X4 (GAUZE/BANDAGES/DRESSINGS) IMPLANT
SUT CHROMIC 4 0 P 3 18 (SUTURE) ×3 IMPLANT
SUT ETHILON 3 0 PS 1 (SUTURE) IMPLANT
SUT ETHILON 4 0 CL P 3 (SUTURE) IMPLANT
SUT ETHILON 6 0 P 1 (SUTURE) IMPLANT
SUT PLAIN 4 0 ~~LOC~~ 1 (SUTURE) ×3 IMPLANT
TOWEL GREEN STERILE FF (TOWEL DISPOSABLE) ×3 IMPLANT
YANKAUER SUCT BULB TIP NO VENT (SUCTIONS) ×3 IMPLANT

## 2020-01-23 NOTE — Anesthesia Preprocedure Evaluation (Addendum)
Anesthesia Evaluation  Patient identified by MRN, date of birth, ID band Patient awake    Reviewed: Allergy & Precautions, NPO status , Patient's Chart, lab work & pertinent test results  History of Anesthesia Complications Negative for: history of anesthetic complications  Airway Mallampati: III  TM Distance: >3 FB Neck ROM: Full    Dental  (+) Dental Advisory Given, Teeth Intact   Pulmonary neg pulmonary ROS,    Pulmonary exam normal        Cardiovascular negative cardio ROS Normal cardiovascular exam     Neuro/Psych  Hx cerebral aneurysm - plan for embolization in future, delayed due to desire for this procedure and need for anticoagulants following embolization  negative psych ROS   GI/Hepatic negative GI ROS, Neg liver ROS,   Endo/Other  negative endocrine ROS  Renal/GU negative Renal ROS     Musculoskeletal  (+) Arthritis ,   Abdominal   Peds  Hematology negative hematology ROS (+)   Anesthesia Other Findings Covid test negative   Reproductive/Obstetrics                            Anesthesia Physical Anesthesia Plan  ASA: II  Anesthesia Plan: General   Post-op Pain Management:    Induction: Intravenous  PONV Risk Score and Plan: 2 and Treatment may vary due to age or medical condition, Ondansetron, Dexamethasone and Midazolam  Airway Management Planned: Oral ETT  Additional Equipment: None  Intra-op Plan:   Post-operative Plan: Extubation in OR  Informed Consent: I have reviewed the patients History and Physical, chart, labs and discussed the procedure including the risks, benefits and alternatives for the proposed anesthesia with the patient or authorized representative who has indicated his/her understanding and acceptance.     Dental advisory given  Plan Discussed with: CRNA and Anesthesiologist  Anesthesia Plan Comments:        Anesthesia Quick  Evaluation

## 2020-01-23 NOTE — Discharge Instructions (Signed)

## 2020-01-23 NOTE — Op Note (Signed)
OPERATIVE REPORT  DATE OF SURGERY: 01/23/2020  PATIENT:  Chad Limbo.,  49 y.o. male  PRE-OPERATIVE DIAGNOSIS:  deviated septum  POST-OPERATIVE DIAGNOSIS:  deviated septum  PROCEDURE:  Procedure(s): SEPTOPLASTY  SURGEON:  Susy Frizzle, MD  ASSISTANTS: none  ANESTHESIA:   General   EBL: 30 ml  DRAINS: none  LOCAL MEDICATIONS USED:  1% Xylocaine with epinephrine  SPECIMEN:  none  COUNTS:  Correct  PROCEDURE DETAILS: The patient was taken to the operating room and placed on the operating table in the supine position. Following induction of general endotracheal anesthesia, the nose was prepped and draped in a standard fashion. Afrin spray was used preoperatively in the holding area. 1% Xylocaine with epinephrine was infiltrated into the septum, the columella, and the inferior turbinates bilaterally.  Nasal septoplasty. A left hemitransfixion incision was created with a 15 scalpel. A mucoperichondrial flap was developed posteriorly down the left side of the nasal septum using a Cottle elevator.  A fracture of the septum was encountered and it was more difficult to raise the flap along this area but eventually was able to expose the bone.  The ethmoid plate was mostly removed with a Jansen-Middleton rongeur.  The cartilaginous septum was bowed towards the left.  The anterior and inferior aspect of the quadrangular cartilage were dissected out of surrounding tissue and about 2-1/2 mm of the inferior aspect was trimmed using a swivel knife.  This allowed the septum to lie more straight and flat in the midline.  The more posterior bony septum was in a nice midline position.  The mucosal incision was reapproximated using interrupted 3-0 chromic.  4-0 plain gut was used to quilt the septal flaps.  The left mucosal flap was intact.  The right side had some tears.  These were all lined up adequately.   All of these maneuvers greatly increased the nasal airways bilaterally. The nasal  cavities were packed with rolled up Telfa coated with bacitracin ointment. The pharynx was suctioned of blood and secretions under direct visualization. The patient was awakened extubated and transferred to recovery in stable condition.    PATIENT DISPOSITION:  To PACU, stable

## 2020-01-23 NOTE — Anesthesia Procedure Notes (Signed)
Procedure Name: Intubation Date/Time: 01/23/2020 8:39 AM Performed by: [place] Sylvana Bonk C, CRNA Pre-anesthesia Checklist: Patient identified, Emergency Drugs available, Suction available and Patient being monitored Patient Re-evaluated:Patient Re-evaluated prior to induction Oxygen Delivery Method: Circle system utilized Preoxygenation: Pre-oxygenation with 100% oxygen Induction Type: IV induction Ventilation: Mask ventilation without difficulty Laryngoscope Size: Mac and 4 Grade View: Grade II Tube type: Oral Tube size: 7.5 mm Number of attempts: 1 Airway Equipment and Method: Stylet and Oral airway Placement Confirmation: ETT inserted through vocal cords under direct vision,  positive ETCO2 and breath sounds checked- equal and bilateral Secured at: 21 cm Tube secured with: Tape Dental Injury: Teeth and Oropharynx as per pre-operative assessment

## 2020-01-23 NOTE — Transfer of Care (Signed)
Immediate Anesthesia Transfer of Care Note  Patient: Bernerd Limbo.  Procedure(s) Performed: SEPTOPLASTY (N/A Nose)  Patient Location: PACU  Anesthesia Type:General  Level of Consciousness: awake, alert  and oriented  Airway & Oxygen Therapy: Patient Spontanous Breathing and Patient connected to face mask oxygen  Post-op Assessment: Report given to RN and Post -op Vital signs reviewed and stable  Post vital signs: Reviewed and stable  Last Vitals:  Vitals Value Taken Time  BP 117/84 01/23/20 0927  Temp    Pulse 76 01/23/20 0929  Resp 9 01/23/20 0929  SpO2 96 % 01/23/20 0929  Vitals shown include unvalidated device data.  Last Pain:  Vitals:   01/23/20 0719  TempSrc: Oral  PainSc: 0-No pain         Complications: No complications documented.

## 2020-01-23 NOTE — Interval H&P Note (Signed)
History and Physical Interval Note:  01/23/2020 8:27 AM  Chad Ruiz.  has presented today for surgery, with the diagnosis of deviated septum.  The various methods of treatment have been discussed with the patient and family. After consideration of risks, benefits and other options for treatment, the patient has consented to  Procedure(s): SEPTOPLASTY (N/A) as a surgical intervention.  The patient's history has been reviewed, patient examined, no change in status, stable for surgery.  I have reviewed the patient's chart and labs.  Questions were answered to the patient's satisfaction.     [name]

## 2020-01-24 ENCOUNTER — Encounter (HOSPITAL_BASED_OUTPATIENT_CLINIC_OR_DEPARTMENT_OTHER): Payer: Self-pay | Admitting: Otolaryngology

## 2020-01-24 NOTE — Anesthesia Postprocedure Evaluation (Signed)
Anesthesia Post Note  Patient: Chad Ruiz.  Procedure(s) Performed: SEPTOPLASTY (N/A Nose)     Patient location during evaluation: PACU Anesthesia Type: General Level of consciousness: awake and alert Pain management: pain level controlled Vital Signs Assessment: post-procedure vital signs reviewed and stable Respiratory status: spontaneous breathing, nonlabored ventilation and respiratory function stable Cardiovascular status: blood pressure returned to baseline and stable Postop Assessment: no apparent nausea or vomiting Anesthetic complications: no   No complications documented.  Last Vitals:  Vitals:   01/23/20 1050 01/23/20 1220  BP: (!) 134/94 (!) 137/98  Pulse:  71  Resp: 12 18  Temp:  36.6 C  SpO2: 97% 97%    Last Pain:  Vitals:   01/24/20 1024  TempSrc:   PainSc: 0-No pain                 Beryle Lathe

## 2020-02-21 ENCOUNTER — Other Ambulatory Visit: Payer: Self-pay

## 2020-02-22 ENCOUNTER — Ambulatory Visit (INDEPENDENT_AMBULATORY_CARE_PROVIDER_SITE_OTHER): Payer: Managed Care, Other (non HMO) | Admitting: Family Medicine

## 2020-02-22 ENCOUNTER — Encounter: Payer: Self-pay | Admitting: Family Medicine

## 2020-02-22 VITALS — BP 136/80 | HR 85 | Temp 98.0°F | Ht 68.0 in | Wt 171.8 lb

## 2020-02-22 DIAGNOSIS — Z0000 Encounter for general adult medical examination without abnormal findings: Secondary | ICD-10-CM

## 2020-02-22 NOTE — Progress Notes (Signed)
Established Patient Office Visit  Subjective:  Patient ID: Chad Ruiz., male    DOB: [birth year]  Age: 49 y.o. MRN: [id number]  CC:  Chief Complaint  Patient presents with  . Annual Exam    CPE, have had some episodes where he was very dizzy to where he could not walk.     HPI Chad Ruiz. presents for complete physical exam.  This is been quite a year for him.  Sustained a significant head injury back in mid April.  He then underwent nasal reconstruction.  Cerebral aneurysm was discovered and he is scheduled for stent placement in the near future.  Also was diagnosed with appendicitis a few months ago.  He has otherwise been doing okay.  Had acute episode of lightheadedness a few weeks ago that may have been related to dehydration.  He denied a spinning sensation, palpitations chest pain or shortness of breath.  He is fasting today.  Past Medical History:  Diagnosis Date  . Brain aneurysm    takes aspirin per MD  . History of chicken pox    childhood  . Kidney calculi     Past Surgical History:  Procedure Laterality Date  . ANKLE SURGERY    . CLOSED REDUCTION NASAL FRACTURE N/A 10/17/2019   Procedure: CLOSED REDUCTION NASAL FRACTURE;  Surgeon: [name], MD;  Location: [facility];  Service: ENT;  Laterality: N/A;  . HAND SURGERY    . IR ANGIO INTRA EXTRACRAN SEL INTERNAL CAROTID BILAT MOD SED  12/14/2019  . IR ANGIO VERTEBRAL SEL VERTEBRAL BILAT MOD SED  12/14/2019  . nose  1996-1998   reconstruction x 3  . SEPTOPLASTY N/A 01/23/2020   Procedure: SEPTOPLASTY;  Surgeon: [name], MD;  Location: [facility];  Service: ENT;  Laterality: N/A;  . TONSILLECTOMY  1979    Family History  Problem Relation Age of Onset  . Hypertension Mother   . Skin cancer Father   . Breast cancer Maternal Grandmother   . Skin cancer Paternal Grandfather   . Heart disease Paternal Grandmother 43       stent placement  . Diabetes Brother 5  . Lung  cancer Paternal Uncle        smoker  . Leukemia Paternal Uncle   . Prostate cancer Neg Hx   . Colon cancer Neg Hx     Social History   Socioeconomic History  . Marital status: Single    Spouse name: Not on file  . Number of children: 0  . Years of education: Not on file  . Highest education level: Not on file  Occupational History  . Occupation: truck Air traffic controller: [employer]  Tobacco Use  . Smoking status: Never Smoker  . Smokeless tobacco: Never Used  Vaping Use  . Vaping Use: Never used  Substance and Sexual Activity  . Alcohol use: No  . Drug use: No  . Sexual activity: Never  Other Topics Concern  . Not on file  Social History Narrative  . Not on file   Social Determinants of Health   Financial Resource Strain:   . Difficulty of Paying Living Expenses: Not on file  Food Insecurity:   . Worried About Programme researcher, broadcasting/film/video in the Last Year: Not on file  . Ran Out of Food in the Last Year: Not on file  Transportation Needs:   . Lack of Transportation (Medical): Not on file  . Lack  of Transportation (Non-Medical): Not on file  Physical Activity:   . Days of Exercise per Week: Not on file  . Minutes of Exercise per Session: Not on file  Stress:   . Feeling of Stress : Not on file  Social Connections:   . Frequency of Communication with Friends and Family: Not on file  . Frequency of Social Gatherings with Friends and Family: Not on file  . Attends Religious Services: Not on file  . Active Member of Clubs or Organizations: Not on file  . Attends Banker Meetings: Not on file  . Marital Status: Not on file  Intimate Partner Violence:   . Fear of Current or Ex-Partner: Not on file  . Emotionally Abused: Not on file  . Physically Abused: Not on file  . Sexually Abused: Not on file    Outpatient Medications Prior to Visit  Medication Sig Dispense Refill  . aspirin EC 81 MG tablet Take 81 mg by mouth daily.    . clopidogrel (PLAVIX) 75 MG tablet  Take 75 mg by mouth daily.    Marland Kitchen HYDROcodone-acetaminophen (NORCO) 7.5-325 MG tablet Take 1 tablet by mouth every 6 (six) hours as needed for moderate pain. (Patient not taking: Reported on 02/22/2020) 20 tablet 0  . promethazine (PHENERGAN) 25 MG suppository Place 1 suppository (25 mg total) rectally every 6 (six) hours as needed for nausea or vomiting. (Patient not taking: Reported on 02/22/2020) 12 suppository 1   No facility-administered medications prior to visit.    No Known Allergies  ROS Review of Systems  Constitutional: Negative.   HENT: Negative.   Eyes: Negative for photophobia and visual disturbance.  Respiratory: Negative.   Cardiovascular: Negative.   Gastrointestinal: Negative.   Genitourinary: Negative.   Musculoskeletal: Negative for gait problem and joint swelling.  Skin: Negative for pallor and rash.  Allergic/Immunologic: Negative for immunocompromised state.  Neurological: Positive for light-headedness. Negative for dizziness, tremors, speech difficulty and headaches.  Hematological: Does not bruise/bleed easily.  Psychiatric/Behavioral: Negative.       Objective:    Physical Exam Vitals and nursing note reviewed.  Constitutional:      General: He is not in acute distress.    Appearance: Normal appearance. He is normal weight. He is not ill-appearing, toxic-appearing or diaphoretic.  HENT:     Head: Normocephalic and atraumatic.     Right Ear: Tympanic membrane, ear canal and external ear normal.     Left Ear: Tympanic membrane, ear canal and external ear normal.     Mouth/Throat:     Mouth: Mucous membranes are dry.     Pharynx: Oropharynx is clear. No oropharyngeal exudate or posterior oropharyngeal erythema.  Eyes:     General: No scleral icterus.       Right eye: No discharge.        Left eye: No discharge.     Extraocular Movements: Extraocular movements intact.     Conjunctiva/sclera: Conjunctivae normal.     Pupils: Pupils are equal, round, and  reactive to light.  Cardiovascular:     Rate and Rhythm: Normal rate and regular rhythm.  Pulmonary:     Effort: Pulmonary effort is normal.     Breath sounds: Normal breath sounds.  Abdominal:     General: There is no distension.     Palpations: There is no mass.     Tenderness: There is no abdominal tenderness. There is no guarding or rebound.     Hernia: No hernia is present.  There is no hernia in the left inguinal area or right inguinal area.  Genitourinary:    Penis: No hypospadias, erythema, tenderness, discharge, swelling or lesions.      Testes:        Right: Mass, tenderness or swelling not present. Right testis is descended.        Left: Mass, tenderness or swelling not present. Left testis is descended.     Epididymis:     Right: Not inflamed or enlarged.     Left: Not inflamed or enlarged.  Musculoskeletal:     Cervical back: No rigidity or tenderness.     Right lower leg: No edema.     Left lower leg: No edema.  Lymphadenopathy:     Cervical: No cervical adenopathy.     Lower Body: No right inguinal adenopathy. No left inguinal adenopathy.  Skin:    General: Skin is warm and dry.  Neurological:     Mental Status: He is alert and oriented to person, place, and time.  Psychiatric:        Mood and Affect: Mood normal.        Behavior: Behavior normal.     BP 136/80   Pulse 85   Temp 98 F (36.7 C) (Tympanic)   Ht 5\' 8"  (1.727 m)   Wt 171 lb 12.8 oz (77.9 kg)   SpO2 97%   BMI 26.12 kg/m  Wt Readings from Last 3 Encounters:  02/22/20 171 lb 12.8 oz (77.9 kg)  01/23/20 172 lb 9.9 oz (78.3 kg)  12/14/19 177 lb (80.3 kg)     Health Maintenance Due  Topic Date Due  . Hepatitis C Screening  Never done  . INFLUENZA VACCINE  Never done    There are no preventive care reminders to display for this patient.  No results found for: TSH Lab Results  Component Value Date   WBC 6.7 12/14/2019   HGB 15.8 12/14/2019   HCT 48.8 12/14/2019   MCV 89.1 12/14/2019    PLT 233 12/14/2019   Lab Results  Component Value Date   NA 137 12/14/2019   K 4.5 12/14/2019   CO2 23 12/14/2019   GLUCOSE 102 (H) 12/14/2019   BUN 26 (H) 12/14/2019   CREATININE 1.11 12/14/2019   BILITOT 0.5 10/25/2017   ALKPHOS 58 10/25/2017   AST 17 10/25/2017   ALT 27 10/25/2017   PROT 6.6 10/25/2017   ALBUMIN 4.1 10/25/2017   CALCIUM 8.6 (L) 12/14/2019   ANIONGAP 8 12/14/2019   GFR 77.84 10/25/2017   Lab Results  Component Value Date   CHOL 202 (H) 10/25/2017   Lab Results  Component Value Date   HDL 46.70 10/25/2017   Lab Results  Component Value Date   LDLCALC 136 (H) 10/25/2017   Lab Results  Component Value Date   TRIG 99.0 10/25/2017   Lab Results  Component Value Date   CHOLHDL 4 10/25/2017   No results found for: HGBA1C    Assessment & Plan:   Problem List Items Addressed This Visit    None    Visit Diagnoses    Healthcare maintenance    -  Primary   Relevant Orders   CBC   Comprehensive metabolic panel   Lipid panel   TSH   Urinalysis, Routine w reflex microscopic      No orders of the defined types were placed in this encounter.   Follow-up: Return in about 1 year (around 02/21/2021), or if symptoms worsen or  fail to improve.    Libby Maw, MD

## 2020-02-22 NOTE — Patient Instructions (Signed)
Health Maintenance, Male Adopting a healthy lifestyle and getting preventive care are important in promoting health and wellness. Ask your health care provider about:  The right schedule for you to have regular tests and exams.  Things you can do on your own to prevent diseases and keep yourself healthy. What should I know about diet, weight, and exercise? Eat a healthy diet   Eat a diet that includes plenty of vegetables, fruits, low-fat dairy products, and lean protein.  Do not eat a lot of foods that are high in solid fats, added sugars, or sodium. Maintain a healthy weight Body mass index (BMI) is a measurement that can be used to identify possible weight problems. It estimates body fat based on height and weight. Your health care provider can help determine your BMI and help you achieve or maintain a healthy weight. Get regular exercise Get regular exercise. This is one of the most important things you can do for your health. Most adults should:  Exercise for at least 150 minutes each week. The exercise should increase your heart rate and make you sweat (moderate-intensity exercise).  Do strengthening exercises at least twice a week. This is in addition to the moderate-intensity exercise.  Spend less time sitting. Even light physical activity can be beneficial. Watch cholesterol and blood lipids Have your blood tested for lipids and cholesterol at 47 years of age, then have this test every 5 years. You may need to have your cholesterol levels checked more often if:  Your lipid or cholesterol levels are high.  You are older than 47 years of age.  You are at high risk for heart disease. What should I know about cancer screening? Many types of cancers can be detected early and may often be prevented. Depending on your health history and family history, you may need to have cancer screening at various ages. This may include screening for:  Colorectal cancer.  Prostate  cancer.  Skin cancer.  Lung cancer. What should I know about heart disease, diabetes, and high blood pressure? Blood pressure and heart disease  High blood pressure causes heart disease and increases the risk of stroke. This is more likely to develop in people who have high blood pressure readings, are of African descent, or are overweight.  Talk with your health care provider about your target blood pressure readings.  Have your blood pressure checked: ? Every 3-5 years if you are 18-39 years of age. ? Every year if you are 47 years old or older.  If you are between the ages of 47 and 75 and are a current or former smoker, ask your health care provider if you should have a one-time screening for abdominal aortic aneurysm (AAA). Diabetes Have regular diabetes screenings. This checks your fasting blood sugar level. Have the screening done:  Once every three years after age 45 if you are at a normal weight and have a low risk for diabetes.  More often and at a younger age if you are overweight or have a high risk for diabetes. What should I know about preventing infection? Hepatitis B If you have a higher risk for hepatitis B, you should be screened for this virus. Talk with your health care provider to find out if you are at risk for hepatitis B infection. Hepatitis C Blood testing is recommended for:  Everyone born from 1945 through 1965.  Anyone with known risk factors for hepatitis C. Sexually transmitted infections (STIs)  You should be screened each year   for STIs, including gonorrhea and chlamydia, if: ? You are sexually active and are younger than 47 years of age. ? You are older than 47 years of age and your health care provider tells you that you are at risk for this type of infection. ? Your sexual activity has changed since you were last screened, and you are at increased risk for chlamydia or gonorrhea. Ask your health care provider if you are at risk.  Ask your  health care provider about whether you are at high risk for HIV. Your health care provider may recommend a prescription medicine to help prevent HIV infection. If you choose to take medicine to prevent HIV, you should first get tested for HIV. You should then be tested every 3 months for as long as you are taking the medicine. Follow these instructions at home: Lifestyle  Do not use any products that contain nicotine or tobacco, such as cigarettes, e-cigarettes, and chewing tobacco. If you need help quitting, ask your health care provider.  Do not use street drugs.  Do not share needles.  Ask your health care provider for help if you need support or information about quitting drugs. Alcohol use  Do not drink alcohol if your health care provider tells you not to drink.  If you drink alcohol: ? Limit how much you have to 0-2 drinks a day. ? Be aware of how much alcohol is in your drink. In the [place] one drink equals one 12 oz bottle of beer (355 mL), one 5 oz glass of wine (148 mL), or one 1 oz glass of hard liquor (44 mL). General instructions  Schedule regular health, dental, and eye exams.  Stay current with your vaccines.  Tell your health care provider if: ? You often feel depressed. ? You have ever been abused or do not feel safe at home. Summary  Adopting a healthy lifestyle and getting preventive care are important in promoting health and wellness.  Follow your health care provider's instructions about healthy diet, exercising, and getting tested or screened for diseases.  Follow your health care provider's instructions on monitoring your cholesterol and blood pressure. This information is not intended to replace advice given to you by your health care provider. Make sure you discuss any questions you have with your health care provider. Document Revised: 05/31/2018 Document Reviewed: 05/31/2018 Elsevier Patient Education  2020 [employer]vier Inc.  Preventive Care 47-64 Years  Old, Male Preventive care refers to lifestyle choices and visits with your health care provider that can promote health and wellness. This includes:  A yearly physical exam. This is also called an annual well check.  Regular dental and eye exams.  Immunizations.  Screening for certain conditions.  Healthy lifestyle choices, such as eating a healthy diet, getting regular exercise, not using drugs or products that contain nicotine and tobacco, and limiting alcohol use. What can I expect for my preventive care visit? Physical exam Your health care provider will check:  Height and weight. These may be used to calculate body mass index (BMI), which is a measurement that tells if you are at a healthy weight.  Heart rate and blood pressure.  Your skin for abnormal spots. Counseling Your health care provider may ask you questions about:  Alcohol, tobacco, and drug use.  Emotional well-being.  Home and relationship well-being.  Sexual activity.  Eating habits.  Work and work environment. What immunizations do I need?  Influenza (flu) vaccine  This is recommended every year. Tetanus, diphtheria,   and pertussis (Tdap) vaccine  You may need a Td booster every 10 years. Varicella (chickenpox) vaccine  You may need this vaccine if you have not already been vaccinated. Zoster (shingles) vaccine  You may need this after age 63. Measles, mumps, and rubella (MMR) vaccine  You may need at least one dose of MMR if you were born in 1957 or later. You may also need a second dose. Pneumococcal conjugate (PCV13) vaccine  You may need this if you have certain conditions and were not previously vaccinated. Pneumococcal polysaccharide (PPSV23) vaccine  You may need one or two doses if you smoke cigarettes or if you have certain conditions. Meningococcal conjugate (MenACWY) vaccine  You may need this if you have certain conditions. Hepatitis A vaccine  You may need this if you have  certain conditions or if you travel or work in places where you may be exposed to hepatitis A. Hepatitis B vaccine  You may need this if you have certain conditions or if you travel or work in places where you may be exposed to hepatitis B. Haemophilus influenzae type b (Hib) vaccine  You may need this if you have certain risk factors. Human papillomavirus (HPV) vaccine  If recommended by your health care provider, you may need three doses over 6 months. You may receive vaccines as individual doses or as more than one vaccine together in one shot (combination vaccines). Talk with your health care provider about the risks and benefits of combination vaccines. What tests do I need? Blood tests  Lipid and cholesterol levels. These may be checked every 5 years, or more frequently if you are over 68 years old.  Hepatitis C test.  Hepatitis B test. Screening  Lung cancer screening. You may have this screening every year starting at age 78 if you have a 30-pack-year history of smoking and currently smoke or have quit within the past 15 years.  Prostate cancer screening. Recommendations will vary depending on your family history and other risks.  Colorectal cancer screening. All adults should have this screening starting at age 38 and continuing until age 22. Your health care provider may recommend screening at age 73 if you are at increased risk. You will have tests every 1-10 years, depending on your results and the type of screening test.  Diabetes screening. This is done by checking your blood sugar (glucose) after you have not eaten for a while (fasting). You may have this done every 1-3 years.  Sexually transmitted disease (STD) testing. Follow these instructions at home: Eating and drinking  Eat a diet that includes fresh fruits and vegetables, whole grains, lean protein, and low-fat dairy products.  Take vitamin and mineral supplements as recommended by your health care  provider.  Do not drink alcohol if your health care provider tells you not to drink.  If you drink alcohol: ? Limit how much you have to 0-2 drinks a day. ? Be aware of how much alcohol is in your drink. In the [place] one drink equals one 12 oz bottle of beer (355 mL), one 5 oz glass of wine (148 mL), or one 1 oz glass of hard liquor (44 mL). Lifestyle  Take daily care of your teeth and gums.  Stay active. Exercise for at least 30 minutes on 5 or more days each week.  Do not use any products that contain nicotine or tobacco, such as cigarettes, e-cigarettes, and chewing tobacco. If you need help quitting, ask your health care provider.  If  you are sexually active, practice safe sex. Use a condom or other form of protection to prevent STIs (sexually transmitted infections).  Talk with your health care provider about taking a low-dose aspirin every day starting at age 50. What's next?  Go to your health care provider once a year for a well check visit.  Ask your health care provider how often you should have your eyes and teeth checked.  Stay up to date on all vaccines. This information is not intended to replace advice given to you by your health care provider. Make sure you discuss any questions you have with your health care provider. Document Revised: 06/01/2018 Document Reviewed: 06/01/2018 Elsevier Patient Education  2020 Elsevier Inc.  

## 2020-02-23 LAB — LIPID PANEL
Cholesterol: 258 mg/dL — ABNORMAL HIGH (ref <200)
HDL: 51 mg/dL (ref >=40)
LDL Cholesterol (Calc): 174 mg/dL (calc) — ABNORMAL HIGH
Non-HDL Cholesterol (Calc): 207 mg/dL (calc) — ABNORMAL HIGH (ref <130)
Total CHOL/HDL Ratio: 5.1 (calc) — ABNORMAL HIGH (ref <5.0)
Triglycerides: 173 mg/dL — ABNORMAL HIGH (ref <150)

## 2020-02-23 LAB — URINALYSIS, ROUTINE W REFLEX MICROSCOPIC
Bilirubin Urine: NEGATIVE
Glucose, UA: NEGATIVE
Hgb urine dipstick: NEGATIVE
Ketones, ur: NEGATIVE
Leukocytes,Ua: NEGATIVE
Nitrite: NEGATIVE
Protein, ur: NEGATIVE
Specific Gravity, Urine: 1.006 (ref 1.001–1.03)
pH: 6 (ref 5.0–8.0)

## 2020-02-23 LAB — COMPREHENSIVE METABOLIC PANEL
AG Ratio: 1.7 (calc) (ref 1.0–2.5)
ALT: 26 U/L (ref 9–46)
AST: 17 U/L (ref 10–40)
Albumin: 4.6 g/dL (ref 3.6–5.1)
Alkaline phosphatase (APISO): 80 U/L (ref 36–130)
BUN: 17 mg/dL (ref 7–25)
CO2: 21 mmol/L (ref 20–32)
Calcium: 9.5 mg/dL (ref 8.6–10.3)
Chloride: 106 mmol/L (ref 98–110)
Creat: 1.08 mg/dL (ref 0.60–1.35)
Globulin: 2.7 g/dL (calc) (ref 1.9–3.7)
Glucose, Bld: 91 mg/dL (ref 65–99)
Potassium: 4 mmol/L (ref 3.5–5.3)
Sodium: 141 mmol/L (ref 135–146)
Total Bilirubin: 0.6 mg/dL (ref 0.2–1.2)
Total Protein: 7.3 g/dL (ref 6.1–8.1)

## 2020-02-23 LAB — TSH: TSH: 1.53 mIU/L (ref 0.40–4.50)

## 2020-02-23 LAB — CBC
HCT: 48.9 % (ref 38.5–50.0)
Hemoglobin: 16.5 g/dL (ref 13.2–17.1)
MCH: 28.8 pg (ref 27.0–33.0)
MCHC: 33.7 g/dL (ref 32.0–36.0)
MCV: 85.5 fL (ref 80.0–100.0)
MPV: 11.6 fL (ref 7.5–12.5)
Platelets: 223 10*3/uL (ref 140–400)
RBC: 5.72 10*6/uL (ref 4.20–5.80)
RDW: 12.6 % (ref 11.0–15.0)
WBC: 6.8 10*3/uL (ref 3.8–10.8)

## 2020-02-29 ENCOUNTER — Other Ambulatory Visit (HOSPITAL_COMMUNITY): Payer: Managed Care, Other (non HMO)

## 2020-03-04 ENCOUNTER — Ambulatory Visit (HOSPITAL_COMMUNITY): Payer: Managed Care, Other (non HMO)

## 2020-03-31 NOTE — Progress Notes (Signed)
[id number] [place]za Evangelist, [place] - [address] [place] [address] Phone: [phone] Fax: [phone]  [id number] - [address] [place] - [address] [facility] [place] [address] Phone: [phone] Fax: [phone]      Your procedure is scheduled on Tuesday October 26th.  Report to [facility] Main Entrance "A" at 6:30 A.M., and check in at the Admitting office.  Call this number if you have problems the morning of surgery:  [phone]  Call [phone] if you have any questions prior to your surgery date Monday-Friday 8am-4pm    Remember:  Do not eat or drink after midnight the night before your surgery    Take these medicines the morning of surgery with A SIP OF WATER   HYDROcodone-acetaminophen (NORCO) 7.5-325 MG tablet as needed for pain  promethazine (PHENERGAN) 25 MG suppository as needed for nausea or vomiting   As of today, STOP taking any Aspirin (unless otherwise instructed by your surgeon) Aleve, Naproxen, Ibuprofen, Motrin, Advil, Goody's, BC's, all herbal medications, fish oil, and all vitamins.                      Do not wear jewelry            Do not wear lotions, powders, colognes, or deodorant.            Do not shave 48 hours prior to surgery.  Men may shave face and neck.            Do not bring valuables to the hospital.            [facility] is not responsible for any belongings or valuables.  Do NOT Smoke (Tobacco/Vaping) or drink Alcohol 24 hours prior to your procedure If you use a CPAP at night, you may bring all equipment for your overnight stay.   Contacts, glasses, dentures or bridgework may not be worn into surgery.      For patients admitted to the hospital, discharge time will be determined by your treatment team.   Patients discharged the day of surgery will not be allowed to drive home, and someone needs to stay with them for 24 hours.    Special instructions:   [facility]- Preparing For Surgery  Before surgery, you can play an important role. Because skin is not sterile, your skin needs to be as free of germs as possible. You can reduce the number of germs on your skin by washing with CHG (chlorahexidine gluconate) Soap before surgery.  CHG is an antiseptic cleaner which kills germs and bonds with the skin to continue killing germs even after washing.    Oral Hygiene is also important to reduce your risk of infection.  Remember - BRUSH YOUR TEETH THE MORNING OF SURGERY WITH YOUR REGULAR TOOTHPASTE  Please do not use if you have an allergy to CHG or antibacterial soaps. If your skin becomes reddened/irritated stop using the CHG.  Do not shave (including legs and underarms) for at least 48 hours prior to first CHG shower. It is OK to shave your face.  Please follow these instructions carefully.   1. Shower the NIGHT BEFORE SURGERY and the MORNING OF SURGERY with CHG Soap.   2. If you chose to wash your hair, wash your hair first as usual with your normal shampoo.  3. After you shampoo, rinse your hair and body thoroughly to remove the shampoo.  4. Use CHG as  you would any other liquid soap. You can apply CHG directly to the skin and wash gently with a scrungie or a clean washcloth.   5. Apply the CHG Soap to your body ONLY FROM THE NECK DOWN.  Do not use on open wounds or open sores. Avoid contact with your eyes, ears, mouth and genitals (private parts). Wash Face and genitals (private parts)  with your normal soap.   6. Wash thoroughly, paying special attention to the area where your surgery will be performed.  7. Thoroughly rinse your body with warm water from the neck down.  8. DO NOT shower/wash with your normal soap after using and rinsing off the CHG Soap.  9. Pat yourself dry with a CLEAN TOWEL.  10. Wear CLEAN PAJAMAS to bed the night before surgery  11. Place CLEAN SHEETS on your bed the night of your first shower and DO NOT SLEEP WITH  PETS.   Day of Surgery: Wear Clean/Comfortable clothing the morning of surgery Do not apply any deodorants/lotions.   Remember to brush your teeth WITH YOUR REGULAR TOOTHPASTE.   Please read over the following fact sheets that you were given.

## 2020-04-01 ENCOUNTER — Other Ambulatory Visit: Payer: Self-pay

## 2020-04-01 ENCOUNTER — Encounter (HOSPITAL_COMMUNITY)
Admission: RE | Admit: 2020-04-01 | Discharge: 2020-04-01 | Disposition: A | Discharge status: Home / Self Care | Payer: Managed Care, Other (non HMO) | Source: Ambulatory Visit | Attending: Neurosurgery | Admitting: Neurosurgery

## 2020-04-01 ENCOUNTER — Encounter (HOSPITAL_COMMUNITY): Payer: Self-pay

## 2020-04-01 ENCOUNTER — Other Ambulatory Visit (HOSPITAL_COMMUNITY): Payer: Managed Care, Other (non HMO)

## 2020-04-01 DIAGNOSIS — Z01812 Encounter for preprocedural laboratory examination: Secondary | ICD-10-CM | POA: Insufficient documentation

## 2020-04-01 HISTORY — DX: Personal history of urinary calculi: Z87.442

## 2020-04-01 LAB — CBC WITH DIFFERENTIAL/PLATELET
Abs Immature Granulocytes: 0.02 10*3/uL (ref 0.00–0.07)
Basophils Absolute: 0.1 10*3/uL (ref 0.0–0.1)
Basophils Relative: 1 %
Eosinophils Absolute: 0.2 10*3/uL (ref 0.0–0.5)
Eosinophils Relative: 3 %
HCT: 51 % (ref 39.0–52.0)
Hemoglobin: 16.4 g/dL (ref 13.0–17.0)
Immature Granulocytes: 0 %
Lymphocytes Relative: 30 %
Lymphs Abs: 2.1 10*3/uL (ref 0.7–4.0)
MCH: 28.8 pg (ref 26.0–34.0)
MCHC: 32.2 g/dL (ref 30.0–36.0)
MCV: 89.5 fL (ref 80.0–100.0)
Monocytes Absolute: 0.5 10*3/uL (ref 0.1–1.0)
Monocytes Relative: 7 %
Neutro Abs: 4.1 10*3/uL (ref 1.7–7.7)
Neutrophils Relative %: 59 %
Platelets: 248 10*3/uL (ref 150–400)
RBC: 5.7 MIL/uL (ref 4.22–5.81)
RDW: 12.5 % (ref 11.5–15.5)
WBC: 7 10*3/uL (ref 4.0–10.5)
nRBC: 0 % (ref 0.0–0.2)

## 2020-04-01 LAB — URINALYSIS, ROUTINE W REFLEX MICROSCOPIC
Bilirubin Urine: NEGATIVE
Glucose, UA: NEGATIVE mg/dL
Hgb urine dipstick: NEGATIVE
Ketones, ur: NEGATIVE mg/dL
Leukocytes,Ua: NEGATIVE
Nitrite: NEGATIVE
Protein, ur: NEGATIVE mg/dL
Specific Gravity, Urine: 1.009 (ref 1.005–1.030)
pH: 5 (ref 5.0–8.0)

## 2020-04-01 LAB — APTT: aPTT: 33 seconds (ref 24–36)

## 2020-04-01 LAB — SURGICAL PCR SCREEN
MRSA, PCR: NEGATIVE
Staphylococcus aureus: POSITIVE — AB

## 2020-04-01 LAB — BASIC METABOLIC PANEL
Anion gap: 11 (ref 5–15)
BUN: 17 mg/dL (ref 6–20)
CO2: 23 mmol/L (ref 22–32)
Calcium: 9.2 mg/dL (ref 8.9–10.3)
Chloride: 107 mmol/L (ref 98–111)
Creatinine, Ser: 1.29 mg/dL — ABNORMAL HIGH (ref 0.61–1.24)
GFR, Estimated: >60 mL/min (ref >60)
Glucose, Bld: 96 mg/dL (ref 70–99)
Potassium: 3.9 mmol/L (ref 3.5–5.1)
Sodium: 141 mmol/L (ref 135–145)

## 2020-04-01 LAB — PROTIME-INR
INR: 1.1 (ref 0.8–1.2)
Prothrombin Time: 13.4 seconds (ref 11.4–15.2)

## 2020-04-01 NOTE — Progress Notes (Signed)
PCP - Dr. Farris Has in [facility]  Cardiologist - Denies  Chest x-ray - not indicated EKG - Not indicated Stress Test - 05/04/16 ECHO - Denies Cardiac Cath - Denies  Sleep Study - Denies  DM - Denies  Blood Thinner Instructions:Plavix instructed by surgeon to stop on 04/01/20 Last dose 03/31/20 and then to restart 1 week before surgery. Aspirin Instructions: Surgeon instructed to continue low dose Aspirin  COVID TEST- 04/11/20   Anesthesia review: No  Patient denies shortness of breath, fever, cough and chest pain at PAT appointment   All instructions explained to the patient, with a verbal understanding of the material. Patient agrees to go over the instructions while at home for a better understanding. Patient also instructed to self quarantine after being tested for COVID-19. The opportunity to ask questions was provided.

## 2020-04-04 ENCOUNTER — Inpatient Hospital Stay (HOSPITAL_COMMUNITY): Admission: RE | Admit: 2020-04-04 | Payer: Managed Care, Other (non HMO) | Source: Ambulatory Visit

## 2020-04-11 ENCOUNTER — Other Ambulatory Visit (HOSPITAL_COMMUNITY)
Admission: RE | Admit: 2020-04-11 | Discharge: 2020-04-11 | Disposition: A | Discharge status: Home / Self Care | Payer: Managed Care, Other (non HMO) | Source: Ambulatory Visit | Attending: Neurosurgery | Admitting: Neurosurgery

## 2020-04-11 DIAGNOSIS — Z01812 Encounter for preprocedural laboratory examination: Secondary | ICD-10-CM | POA: Insufficient documentation

## 2020-04-11 DIAGNOSIS — Z20822 Contact with and (suspected) exposure to covid-19: Secondary | ICD-10-CM | POA: Insufficient documentation

## 2020-04-11 LAB — SARS CORONAVIRUS 2 (TAT 6-24 HRS): SARS Coronavirus 2: NEGATIVE

## 2020-04-14 ENCOUNTER — Encounter (HOSPITAL_COMMUNITY): Payer: Self-pay | Admitting: Certified Registered"

## 2020-04-14 ENCOUNTER — Encounter (HOSPITAL_COMMUNITY): Payer: Self-pay | Admitting: Neurosurgery

## 2020-04-15 ENCOUNTER — Inpatient Hospital Stay (HOSPITAL_COMMUNITY)
Admission: RE | Admit: 2020-04-15 | Payer: No Typology Code available for payment source | Source: Ambulatory Visit | Admitting: Neurosurgery

## 2020-04-15 ENCOUNTER — Encounter (HOSPITAL_COMMUNITY): Payer: Self-pay

## 2020-04-15 ENCOUNTER — Inpatient Hospital Stay (HOSPITAL_COMMUNITY): Admission: RE | Admit: 2020-04-15 | Payer: Managed Care, Other (non HMO) | Source: Ambulatory Visit

## 2020-04-15 ENCOUNTER — Encounter (HOSPITAL_COMMUNITY): Admission: RE | Payer: Self-pay | Source: Ambulatory Visit

## 2020-04-15 SURGERY — IR WITH ANESTHESIA
Anesthesia: General

## 2020-07-31 ENCOUNTER — Other Ambulatory Visit: Payer: Self-pay | Admitting: Neurosurgery

## 2020-08-01 ENCOUNTER — Other Ambulatory Visit: Payer: Self-pay | Admitting: Neurosurgery

## 2020-08-11 NOTE — Pre-Procedure Instructions (Signed)
Chad Ruiz.  08/11/2020    Your procedure is scheduled on Fri., Feb. 25, 2022 from 11:14AM-3:14PM.  Report to [facility] Entrance "A" at 9:15AM  Call this number if you have problems the morning of surgery:  [phone]   Remember:  Do not eat or drink after midnight on Feb. 24th    Take these medicines the morning of surgery with A SIP OF WATER:  Follow your surgeon's instructions on when to stop Aspirin and Plavix.  If no instructions were given by your surgeon then you will need to call the office to get those instructions.    As of today, STOP taking all Aspirin (unless instructed by your doctor) and Other Aspirin containing products, Vitamins, Fish oils, and Herbal medications. Also stop all NSAIDS i.e. Advil, Ibuprofen, Motrin, Aleve, Anaprox, Naproxen, BC, Goody Powders, and all Supplements.   No Smoking of any kind, Tobacco/Vaping, or Alcohol products 24 hours prior to your procedure. If you use a Cpap at night, you may bring all equipment for your overnight stay.    Day of Surgery:  Do not wear jewelry, make-up or nail polish.  Do not wear lotions, powders, or perfumes, or deodorant.  Do not shave 48 hours prior to surgery.    Do not bring valuables to the hospital.  [facility] is not responsible for any belongings or valuables.  Contacts, dentures or bridgework may not be worn into surgery.    For patients admitted to the hospital, discharge time will be determined by your treatment team.  Patients discharged the day of surgery will not be allowed to drive home, and someone age 2 and over needs to stay with them for 24 hours.   Special instructions:   [facility]- Preparing For Surgery  Before surgery, you can play an important role. Because skin is not sterile, your skin needs to be as free of germs as possible. You can reduce the number of germs on your skin by washing with CHG (chlorahexidine gluconate) Soap before surgery.  CHG is an  antiseptic cleaner which kills germs and bonds with the skin to continue killing germs even after washing.    Oral Hygiene is also important to reduce your risk of infection.  Remember - BRUSH YOUR TEETH THE MORNING OF SURGERY WITH YOUR REGULAR TOOTHPASTE  Please do not use if you have an allergy to CHG or antibacterial soaps. If your skin becomes reddened/irritated stop using the CHG.  Do not shave (including legs and underarms) for at least 48 hours prior to first CHG shower. It is OK to shave your face.  Please follow these instructions carefully.   1. Shower the NIGHT BEFORE SURGERY and the MORNING OF SURGERY with CHG.   2. If you chose to wash your hair, wash your hair first as usual with your normal shampoo.  3. After you shampoo, rinse your hair and body thoroughly to remove the shampoo.  4. Use CHG as you would any other liquid soap. You can apply CHG directly to the skin and wash gently with a scrungie or a clean washcloth.   5. Apply the CHG Soap to your body ONLY FROM THE NECK DOWN.  Do not use on open wounds or open sores. Avoid contact with your eyes, ears, mouth and genitals (private parts). Wash Face and genitals (private parts)  with your normal soap.  6. Wash thoroughly, paying special attention to the area where your surgery will be performed.  7.  Thoroughly rinse your body with warm water from the neck down.  8. DO NOT shower/wash with your normal soap after using and rinsing off the CHG Soap.  9. Pat yourself dry with a CLEAN TOWEL.  10. Wear CLEAN PAJAMAS to bed the night before surgery, wear comfortable clothes the morning of surgery  11. Place CLEAN SHEETS on your bed the night of your first shower and DO NOT SLEEP WITH PETS.  Reminders: Do not apply any deodorants/lotions.  Please wear clean clothes to the hospital/surgery center.   Remember to brush your teeth WITH YOUR REGULAR TOOTHPASTE.  Please read over the following fact sheets that you were  given.

## 2020-08-12 ENCOUNTER — Encounter (HOSPITAL_COMMUNITY)
Admission: RE | Admit: 2020-08-12 | Discharge: 2020-08-12 | Disposition: A | Discharge status: Home / Self Care | Payer: No Typology Code available for payment source | Source: Ambulatory Visit | Attending: Neurosurgery | Admitting: Neurosurgery

## 2020-08-12 ENCOUNTER — Other Ambulatory Visit: Payer: Self-pay

## 2020-08-12 ENCOUNTER — Other Ambulatory Visit (HOSPITAL_COMMUNITY)
Admission: RE | Admit: 2020-08-12 | Discharge: 2020-08-12 | Disposition: A | Discharge status: Home / Self Care | Payer: No Typology Code available for payment source | Source: Ambulatory Visit | Attending: Neurosurgery | Admitting: Neurosurgery

## 2020-08-12 ENCOUNTER — Encounter (HOSPITAL_COMMUNITY): Payer: Self-pay

## 2020-08-12 DIAGNOSIS — Z01812 Encounter for preprocedural laboratory examination: Secondary | ICD-10-CM | POA: Insufficient documentation

## 2020-08-12 DIAGNOSIS — Z20822 Contact with and (suspected) exposure to covid-19: Secondary | ICD-10-CM | POA: Diagnosis not present

## 2020-08-12 HISTORY — DX: Pneumonia, unspecified organism: J18.9

## 2020-08-12 HISTORY — DX: Unilateral inguinal hernia, without obstruction or gangrene, not specified as recurrent: K40.90

## 2020-08-12 LAB — CBC WITH DIFFERENTIAL/PLATELET
Abs Immature Granulocytes: 0.06 10*3/uL (ref 0.00–0.07)
Basophils Absolute: 0.1 10*3/uL (ref 0.0–0.1)
Basophils Relative: 1 %
Eosinophils Absolute: 0.3 10*3/uL (ref 0.0–0.5)
Eosinophils Relative: 5 %
HCT: 51.6 % (ref 39.0–52.0)
Hemoglobin: 16.5 g/dL (ref 13.0–17.0)
Immature Granulocytes: 1 %
Lymphocytes Relative: 31 %
Lymphs Abs: 2.1 10*3/uL (ref 0.7–4.0)
MCH: 28.4 pg (ref 26.0–34.0)
MCHC: 32 g/dL (ref 30.0–36.0)
MCV: 88.8 fL (ref 80.0–100.0)
Monocytes Absolute: 0.6 10*3/uL (ref 0.1–1.0)
Monocytes Relative: 9 %
Neutro Abs: 3.6 10*3/uL (ref 1.7–7.7)
Neutrophils Relative %: 53 %
Platelets: 224 10*3/uL (ref 150–400)
RBC: 5.81 MIL/uL (ref 4.22–5.81)
RDW: 12.8 % (ref 11.5–15.5)
WBC: 6.9 10*3/uL (ref 4.0–10.5)
nRBC: 0 % (ref 0.0–0.2)

## 2020-08-12 LAB — BASIC METABOLIC PANEL
Anion gap: 10 (ref 5–15)
BUN: 19 mg/dL (ref 6–20)
CO2: 24 mmol/L (ref 22–32)
Calcium: 9.1 mg/dL (ref 8.9–10.3)
Chloride: 108 mmol/L (ref 98–111)
Creatinine, Ser: 1.21 mg/dL (ref 0.61–1.24)
GFR, Estimated: >60 mL/min (ref >60)
Glucose, Bld: 110 mg/dL — ABNORMAL HIGH (ref 70–99)
Potassium: 4 mmol/L (ref 3.5–5.1)
Sodium: 142 mmol/L (ref 135–145)

## 2020-08-12 LAB — URINALYSIS, ROUTINE W REFLEX MICROSCOPIC
Bilirubin Urine: NEGATIVE
Glucose, UA: NEGATIVE mg/dL
Hgb urine dipstick: NEGATIVE
Ketones, ur: NEGATIVE mg/dL
Leukocytes,Ua: NEGATIVE
Nitrite: NEGATIVE
Protein, ur: NEGATIVE mg/dL
Specific Gravity, Urine: 1.018 (ref 1.005–1.030)
pH: 5 (ref 5.0–8.0)

## 2020-08-12 LAB — SARS CORONAVIRUS 2 (TAT 6-24 HRS): SARS Coronavirus 2: NEGATIVE

## 2020-08-12 LAB — PROTIME-INR
INR: 1 (ref 0.8–1.2)
Prothrombin Time: 12.5 seconds (ref 11.4–15.2)

## 2020-08-12 LAB — APTT: aPTT: 30 seconds (ref 24–36)

## 2020-08-12 NOTE — Progress Notes (Signed)
PCP - Mliss Sax, MD Cardiologist - Denies  PPM/ICD - Denies  Chest x-ray - N/A EKG - N/A Stress Test - 05/04/16 (Care Everywhere) ECHO - Denies Cardiac Cath - Denies  Sleep Study - Denies   Patient denies being diabetic.  Blood Thinner Instructions: Per patient, continue until DOS and 6 mths after sx. Aspirin Instructions: Per patient, continue until DOS and 6 mths after sx.  ERAS Protcol - N/A PRE-SURGERY Ensure or G2- N/A  COVID TEST- 08/12/20   Anesthesia review: No  Patient denies shortness of breath, fever, cough and chest pain at PAT appointment   All instructions explained to the patient, with a verbal understanding of the material. Patient agrees to go over the instructions while at home for a better understanding. Patient also instructed to self quarantine after being tested for COVID-19. The opportunity to ask questions was provided.

## 2020-08-12 NOTE — Progress Notes (Signed)
Elevated BP at PAT Appointment: Elevated DBP. Pt denies pain, SHOB, no acute distress noted. Per patient, his BP tends to be elevated in clinical settings but once he goes home and checks it, it is lower.   08/12/20 0805 08/12/20 0815 08/12/20 0842  Vitals  Temp 97.8 F (36.6 C)  --   --   Temp Source Oral  --   --   Pulse Rate 79  --   --   Resp 18  --   --   BP (!) 127/98  --  (!) 135/97  SpO2 97 %  --   --   Height and Weight  Height 5\' 8"  (1.727 m)  --   --   Height Method Stated  --   --   Weight 78.1 kg  --   --   Weight Method Actual  --   --   BMI (Calculated) 26.19  --   --   BSA (Calculated - sq m) 1.94 sq meters  --   --   Pain Assessment  Pain Scale  --  0-10  --   Pain Score  --  0  --

## 2020-08-12 NOTE — Pre-Procedure Instructions (Signed)
Surgical Instructions:    Your procedure is scheduled on Friday 08/15/20 (11:14 AM-3:14 PM).  Report to [facility] Main Entrance "A" at 09:15 A.M., then check in with the Admitting office.  Call this number if you have problems the morning of surgery:  [phone]   If you have any questions prior to your surgery date call [phone]: Open Monday-Friday 8am-4pm    Remember:  Do not eat or drink after midnight the night before your surgery.     Take these medicines the morning of surgery with A SIP OF WATER: Aspirin clopidogrel (PLAVIX)    As of today, STOP taking all NSAIDS i.e. Aleve, Naproxen, Ibuprofen, Motrin, Advil, Goody's, BC's, all herbal medications, fish oil, and all vitamins.           The Morning of Surgery:            Do not wear jewelry.            Do not wear lotions, powders, colognes, or deodorant.            Do not shave 48 hours prior to surgery.  Men may shave face and neck.            Do not bring valuables to the hospital.            [facility] is not responsible for any belongings or valuables.  Do NOT Smoke (Tobacco/Vaping) or drink Alcohol 24 hours prior to your procedure.  If you use a CPAP at night, you may bring all equipment for your overnight stay.   Contacts, glasses, dentures or bridgework may not be worn into surgery, please bring cases for these belongings   For patients admitted to the hospital, discharge time will be determined by your treatment team.   Patients discharged the day of surgery will not be allowed to drive home, and someone needs to stay with them for 24 hours.    Special instructions:   Chad Ruiz- Preparing For Surgery  Before surgery, you can play an important role. Because skin is not sterile, your skin needs to be as free of germs as possible. You can reduce the number of germs on your skin by washing with CHG (chlorahexidine gluconate) Soap before surgery.  CHG is an antiseptic cleaner which kills germs and  bonds with the skin to continue killing germs even after washing.    Oral Hygiene is also important to reduce your risk of infection.  Remember - BRUSH YOUR TEETH THE MORNING OF SURGERY WITH YOUR REGULAR TOOTHPASTE  Please do not use if you have an allergy to CHG or antibacterial soaps. If your skin becomes reddened/irritated stop using the CHG.  Do not shave (including legs and underarms) for at least 48 hours prior to first CHG shower. It is OK to shave your face.  Please follow these instructions carefully.   1. Shower the NIGHT BEFORE SURGERY and the MORNING OF SURGERY  2. If you chose to wash your hair, wash your hair first as usual with your normal shampoo.  3. After you shampoo, rinse your hair and body thoroughly to remove the shampoo.  4. Wash Face and genitals (private parts) with your normal soap.   5.  Shower the NIGHT BEFORE SURGERY and the MORNING OF SURGERY with CHG Soap.   6. Use CHG Soap as you would any other liquid soap. You can apply CHG directly to the skin and wash gently with a scrungie or a clean washcloth.  7. Apply the CHG Soap to your body ONLY FROM THE NECK DOWN.  Do not use on open wounds or open sores. Avoid contact with your eyes, ears, mouth and genitals (private parts). Wash Face and genitals (private parts)  with your normal soap.   8. Wash thoroughly, paying special attention to the area where your surgery will be performed.  9. Thoroughly rinse your body with warm water from the neck down.  10. DO NOT shower/wash with your normal soap after using and rinsing off the CHG Soap.  11. Pat yourself dry with a CLEAN TOWEL.  12. Wear CLEAN PAJAMAS to bed the night before surgery  13. Place CLEAN SHEETS on your bed the night before your surgery  14. DO NOT SLEEP WITH PETS.   Day of Surgery: SHOWER. Wear Clean/Comfortable clothing the morning of surgery Do not apply any deodorants/lotions.   Remember to brush your teeth WITH YOUR REGULAR  TOOTHPASTE.   Please read over the following fact sheets that you were given.

## 2020-08-14 NOTE — H&P (Signed)
Chief Complaint   No chief complaint on file.   HPI   HPI: Chad Ruiz. is a 50 y.o. male who was found to have a fusiform left vertebral artery aneurysm after he was involved in a work accident in which a steel frame fell on him.  He had previously undergone diagnostic cerebral angiogram for further characterization.  We were planning to treat this aneurysm with a pipeline embolization device however he needed surgery on his nose prior to it because of the need for aspirin and Plavix during the aneurysm treatment and for six months afterwards.  Now that his nose surgeries have been completed, he is ready to proceed with pipeline embolization.  He does note some continued left-sided headaches although the frequency and severity has improved somewhat over the last few months. He presents today for surgery. He is without any concerns. He has been taking aspirin 325 mg and Plavix 75 mg daily for the past 7 days in preparation for surgery today. He has not noted any abnormal bleeding.   Patient Active Problem List   Diagnosis Date Noted  . Lateral epicondylitis of right elbow 10/25/2017  . Encounter for health maintenance examination with abnormal findings 10/25/2017  . Orchitis and epididymitis 10/25/2017  . Abnormal liver enzymes 04/13/2012  . Inguinal pain, left 10/24/2011  . Pulmonary nodule 10/24/2011    PMH: Past Medical History:  Diagnosis Date  . Brain aneurysm    takes aspirin per MD  . History of chicken pox    childhood  . History of kidney stones   . Inguinal hernia   . Kidney calculi   . Pneumonia     PSH: Past Surgical History:  Procedure Laterality Date  . ANKLE SURGERY    . APPENDECTOMY    . CLOSED REDUCTION NASAL FRACTURE N/A 10/17/2019   Procedure: CLOSED REDUCTION NASAL FRACTURE;  Surgeon: [name], MD;  Location: [facility];  Service: ENT;  Laterality: N/A;  . FRACTURE SURGERY    . HAND SURGERY    . HERNIA REPAIR  2015  . IR  ANGIO INTRA EXTRACRAN SEL INTERNAL CAROTID BILAT MOD SED  12/14/2019  . IR ANGIO VERTEBRAL SEL VERTEBRAL BILAT MOD SED  12/14/2019  . nose  1996-1998   reconstruction x 3  . SEPTOPLASTY N/A 01/23/2020   Procedure: SEPTOPLASTY;  Surgeon: [name], MD;  Location: [facility];  Service: ENT;  Laterality: N/A;  . TONSILLECTOMY  1979    (Not in a hospital admission)   SH: Social History   Tobacco Use  . Smoking status: Never Smoker  . Smokeless tobacco: Never Used  Vaping Use  . Vaping Use: Never used  Substance Use Topics  . Alcohol use: No  . Drug use: No    MEDS: Prior to Admission medications   Medication Sig Start Date End Date Taking? Authorizing Provider  aspirin EC 81 MG tablet Take 81 mg by mouth daily. Swallow whole.    [provider]  clopidogrel (PLAVIX) 75 MG tablet Take 75 mg by mouth daily. 12/26/19   [provider]    ALLERGY: No Known Allergies  Social History   Tobacco Use  . Smoking status: Never Smoker  . Smokeless tobacco: Never Used  Substance Use Topics  . Alcohol use: No     Family History  Problem Relation Age of Onset  . Hypertension Mother   . Skin cancer Father   . Breast cancer Maternal Grandmother   .  Skin cancer Paternal Grandfather   . Heart disease Paternal Grandmother 65       stent placement  . Diabetes Brother 29  . Lung cancer Paternal Uncle        smoker  . Leukemia Paternal Uncle   . Prostate cancer Neg Hx   . Colon cancer Neg Hx      ROS   ROS  Exam   There were no vitals filed for this visit. General appearance: WDWN, NAD Eyes: No scleral injection Cardiovascular: Regular rate and rhythm without murmurs, rubs, gallops. No edema or variciosities. Distal pulses normal. Pulmonary: Effort normal, non-labored breathing Musculoskeletal:     Muscle tone upper extremities: Normal    Muscle tone lower extremities: Normal    Motor exam: Upper Extremities Deltoid Bicep Tricep Grip   Right 5/5 5/5 5/5 5/5  Left 5/5 5/5 5/5 5/5   Lower Extremity IP Quad PF DF EHL  Right 5/5 5/5 5/5 5/5 5/5  Left 5/5 5/5 5/5 5/5 5/5   Neurological Mental Status:    - Patient is awake, alert, oriented to person, place, month, year, and situation    - Patient is able to give a clear and coherent history.    - No signs of aphasia or neglect Cranial Nerves    - II: Visual Fields are full. PERRL    - III/IV/VI: EOMI without ptosis or diploplia.     - V: Facial sensation is grossly normal    - VII: Facial movement is symmetric.     - VIII: hearing is intact to voice    - X: Uvula elevates symmetrically    - XI: Shoulder shrug is symmetric.    - XII: tongue is midline without atrophy or fasciculations.  Sensory: Sensation grossly intact to LT   Results - Imaging/Labs   Results for orders placed or performed during the hospital encounter of 08/12/20 (from the past 48 hour(s))  SARS CORONAVIRUS 2 (TAT 6-24 HRS) Nasopharyngeal Nasopharyngeal Swab     Status: None   Collection Time: 08/12/20  9:16 AM   Specimen: Nasopharyngeal Swab  Result Value Ref Range   SARS Coronavirus 2 NEGATIVE NEGATIVE    Comment: (NOTE) SARS-CoV-2 target nucleic acids are NOT DETECTED.  The SARS-CoV-2 RNA is generally detectable in upper and lower respiratory specimens during the acute phase of infection. Negative results do not preclude SARS-CoV-2 infection, do not rule out co-infections with other pathogens, and should not be used as the sole basis for treatment or other patient management decisions. Negative results must be combined with clinical observations, patient history, and epidemiological information. The expected result is Negative.  Fact Sheet for Patients: HairSlick.no  Fact Sheet for Healthcare Providers: quierodirigir.com  This test is not yet approved or cleared by the [place] FDA and  has been authorized for detection  and/or diagnosis of SARS-CoV-2 by FDA under an Emergency Use Authorization (EUA). This EUA will remain  in effect (meaning this test can be used) for the duration of the COVID-19 declaration under Se ction 564(b)(1) of the Act, 21 U.S.C. section 360bbb-3(b)(1), unless the authorization is terminated or revoked sooner.  Performed at [facility], [address] [place], [place] [address]     No results found.  IMAGING: Follow-up CT angiogram of the head was personally reviewed from January 2022. This again demonstrates the fusiform enlargement of the V4 segment of the left vertebral artery, measuring approximately 8-9 mm in maximal diameter.  This appears largely stable  in comparison to the angiogram.  Impression/Plan   50 y.o. male with fusiform left vertebral artery aneurysm that will require treatment. We will proceed with pipeline embolization of the left vertebral artery aneurysm.  Patient has been taking daily aspirin and Plavix for the past 7 days in preparation to surgery today.  He has not noted any abnormal bleeding.  We have reviewed the indications for surgery, the associated risks, benefits and alternatives at length in the office.  All questions today were answered and consent was obtained.  Lisbeth Renshaw, MD [facility]     fusiform left vertebral artery aneurysm after he was involved in a work accident in which a steel frame fell on him.

## 2020-08-15 ENCOUNTER — Inpatient Hospital Stay (HOSPITAL_COMMUNITY)
Admission: RE | Admit: 2020-08-15 | Discharge: 2020-08-16 | DRG: 254 | Disposition: A | Discharge status: Home / Self Care | Payer: No Typology Code available for payment source | Attending: Neurological Surgery | Admitting: Neurological Surgery

## 2020-08-15 ENCOUNTER — Inpatient Hospital Stay (HOSPITAL_COMMUNITY): Payer: No Typology Code available for payment source | Admitting: Certified Registered Nurse Anesthetist

## 2020-08-15 ENCOUNTER — Other Ambulatory Visit: Payer: Self-pay

## 2020-08-15 ENCOUNTER — Encounter (HOSPITAL_COMMUNITY): Payer: Self-pay | Admitting: Neurosurgery

## 2020-08-15 ENCOUNTER — Inpatient Hospital Stay (HOSPITAL_COMMUNITY)
Admission: RE | Admit: 2020-08-15 | Discharge: 2020-08-15 | Disposition: A | Discharge status: Home / Self Care | Payer: No Typology Code available for payment source | Source: Home / Self Care | Attending: Neurosurgery | Admitting: Neurosurgery

## 2020-08-15 ENCOUNTER — Encounter (HOSPITAL_COMMUNITY)
Admission: RE | Disposition: A | Discharge status: Home / Self Care | Payer: Self-pay | Source: Home / Self Care | Attending: Neurosurgery

## 2020-08-15 DIAGNOSIS — Z8701 Personal history of pneumonia (recurrent): Secondary | ICD-10-CM

## 2020-08-15 DIAGNOSIS — Z8619 Personal history of other infectious and parasitic diseases: Secondary | ICD-10-CM

## 2020-08-15 DIAGNOSIS — W208XXA Other cause of strike by thrown, projected or falling object, initial encounter: Secondary | ICD-10-CM | POA: Diagnosis present

## 2020-08-15 DIAGNOSIS — Z20822 Contact with and (suspected) exposure to covid-19: Secondary | ICD-10-CM | POA: Diagnosis present

## 2020-08-15 DIAGNOSIS — I726 Aneurysm of vertebral artery: Principal | ICD-10-CM | POA: Diagnosis present

## 2020-08-15 DIAGNOSIS — Z806 Family history of leukemia: Secondary | ICD-10-CM | POA: Diagnosis not present

## 2020-08-15 DIAGNOSIS — Z7902 Long term (current) use of antithrombotics/antiplatelets: Secondary | ICD-10-CM

## 2020-08-15 DIAGNOSIS — Z87442 Personal history of urinary calculi: Secondary | ICD-10-CM | POA: Diagnosis not present

## 2020-08-15 DIAGNOSIS — Y990 Civilian activity done for income or pay: Secondary | ICD-10-CM

## 2020-08-15 DIAGNOSIS — Z8249 Family history of ischemic heart disease and other diseases of the circulatory system: Secondary | ICD-10-CM

## 2020-08-15 DIAGNOSIS — Z7982 Long term (current) use of aspirin: Secondary | ICD-10-CM

## 2020-08-15 DIAGNOSIS — Z833 Family history of diabetes mellitus: Secondary | ICD-10-CM | POA: Diagnosis not present

## 2020-08-15 DIAGNOSIS — Z9889 Other specified postprocedural states: Secondary | ICD-10-CM

## 2020-08-15 DIAGNOSIS — I671 Cerebral aneurysm, nonruptured: Secondary | ICD-10-CM

## 2020-08-15 HISTORY — PX: IR TRANSCATH/EMBOLIZ: IMG695

## 2020-08-15 HISTORY — PX: RADIOLOGY WITH ANESTHESIA: SHX6223

## 2020-08-15 LAB — CBC
HCT: 45.2 % (ref 39.0–52.0)
Hemoglobin: 15.3 g/dL (ref 13.0–17.0)
MCH: 29.1 pg (ref 26.0–34.0)
MCHC: 33.8 g/dL (ref 30.0–36.0)
MCV: 86.1 fL (ref 80.0–100.0)
Platelets: 233 10*3/uL (ref 150–400)
RBC: 5.25 MIL/uL (ref 4.22–5.81)
RDW: 12.7 % (ref 11.5–15.5)
WBC: 9.9 10*3/uL (ref 4.0–10.5)
nRBC: 0 % (ref 0.0–0.2)

## 2020-08-15 LAB — CREATININE, SERUM
Creatinine, Ser: 1.06 mg/dL (ref 0.61–1.24)
GFR, Estimated: >60 mL/min

## 2020-08-15 LAB — PREPARE RBC (CROSSMATCH)

## 2020-08-15 LAB — ABO/RH: ABO/RH(D): A POS

## 2020-08-15 SURGERY — IR WITH ANESTHESIA
Anesthesia: General

## 2020-08-15 MED ORDER — SODIUM CHLORIDE 0.9 % IV SOLN: INTRAVENOUS | Status: DC

## 2020-08-15 MED ORDER — ROCURONIUM BROMIDE 10 MG/ML (PF) SYRINGE
PREFILLED_SYRINGE | INTRAVENOUS | Status: AC
  Filled 2020-08-15: qty 10

## 2020-08-15 MED ORDER — HEPARIN SODIUM (PORCINE) 5000 UNIT/ML IJ SOLN
5000.0000 [IU] | Freq: Three times a day (TID) | INTRAMUSCULAR | Status: DC
  Administered 2020-08-16: 5000 [IU] via SUBCUTANEOUS
  Filled 2020-08-15: qty 1

## 2020-08-15 MED ORDER — CHLORHEXIDINE GLUCONATE CLOTH 2 % EX PADS: 6.0000 | MEDICATED_PAD | Freq: Once | CUTANEOUS | Status: DC

## 2020-08-15 MED ORDER — DEXAMETHASONE SODIUM PHOSPHATE 10 MG/ML IJ SOLN
INTRAMUSCULAR | Status: DC | PRN
  Administered 2020-08-15: 5 mg via INTRAVENOUS

## 2020-08-15 MED ORDER — LIDOCAINE 2% (20 MG/ML) 5 ML SYRINGE
INTRAMUSCULAR | Status: DC | PRN
  Administered 2020-08-15: 100 mg via INTRAVENOUS

## 2020-08-15 MED ORDER — ASPIRIN EC 81 MG PO TBEC
81.0000 mg | DELAYED_RELEASE_TABLET | Freq: Every day | ORAL | Status: DC
  Administered 2020-08-16: 81 mg via ORAL
  Filled 2020-08-15: qty 1

## 2020-08-15 MED ORDER — LACTATED RINGERS IV SOLN: INTRAVENOUS | Status: DC | PRN

## 2020-08-15 MED ORDER — MORPHINE SULFATE (PF) 2 MG/ML IV SOLN: 1.0000 mg | INTRAVENOUS | Status: DC | PRN

## 2020-08-15 MED ORDER — PROPOFOL 10 MG/ML IV BOLUS
INTRAVENOUS | Status: AC
  Filled 2020-08-15: qty 20

## 2020-08-15 MED ORDER — ESMOLOL HCL 100 MG/10ML IV SOLN
INTRAVENOUS | Status: DC | PRN
  Administered 2020-08-15: 30 ug via INTRAVENOUS

## 2020-08-15 MED ORDER — ORAL CARE MOUTH RINSE: 15.0000 mL | Freq: Once | OROMUCOSAL | Status: AC

## 2020-08-15 MED ORDER — CHLORHEXIDINE GLUCONATE 0.12 % MT SOLN: 15.0000 mL | Freq: Once | OROMUCOSAL | Status: AC

## 2020-08-15 MED ORDER — CEFAZOLIN SODIUM-DEXTROSE 2-4 GM/100ML-% IV SOLN
2.0000 g | INTRAVENOUS | Status: AC
  Administered 2020-08-15: 2 g via INTRAVENOUS

## 2020-08-15 MED ORDER — FENTANYL CITRATE (PF) 250 MCG/5ML IJ SOLN
INTRAMUSCULAR | Status: DC | PRN
  Administered 2020-08-15: 100 ug via INTRAVENOUS

## 2020-08-15 MED ORDER — FENTANYL CITRATE (PF) 100 MCG/2ML IJ SOLN
25.0000 ug | INTRAMUSCULAR | Status: DC | PRN
Start: 2020-08-15 — End: 2020-08-15

## 2020-08-15 MED ORDER — HYDRALAZINE HCL 20 MG/ML IJ SOLN
INTRAMUSCULAR | Status: AC
  Filled 2020-08-15: qty 1

## 2020-08-15 MED ORDER — PROPOFOL 10 MG/ML IV BOLUS
INTRAVENOUS | Status: DC | PRN
  Administered 2020-08-15: 150 mg via INTRAVENOUS

## 2020-08-15 MED ORDER — CEFAZOLIN SODIUM-DEXTROSE 2-4 GM/100ML-% IV SOLN
INTRAVENOUS | Status: AC
  Filled 2020-08-15: qty 100

## 2020-08-15 MED ORDER — ASPIRIN-ACETAMINOPHEN-CAFFEINE 250-250-65 MG PO TABS
1.0000 | ORAL_TABLET | Freq: Two times a day (BID) | ORAL | Status: DC | PRN
  Administered 2020-08-15 – 2020-08-16 (×3): 1 via ORAL
  Filled 2020-08-15 (×4): qty 2

## 2020-08-15 MED ORDER — ROCURONIUM BROMIDE 100 MG/10ML IV SOLN
INTRAVENOUS | Status: DC | PRN
  Administered 2020-08-15: 50 mg via INTRAVENOUS

## 2020-08-15 MED ORDER — SODIUM CHLORIDE 0.9% IV SOLUTION: Freq: Once | INTRAVENOUS | Status: DC

## 2020-08-15 MED ORDER — ONDANSETRON HCL 4 MG/2ML IJ SOLN
INTRAMUSCULAR | Status: DC | PRN
  Administered 2020-08-15: 4 mg via INTRAVENOUS

## 2020-08-15 MED ORDER — CHLORHEXIDINE GLUCONATE CLOTH 2 % EX PADS: 6.0000 | MEDICATED_PAD | Freq: Every day | CUTANEOUS | Status: DC

## 2020-08-15 MED ORDER — SUGAMMADEX SODIUM 200 MG/2ML IV SOLN
INTRAVENOUS | Status: DC | PRN
  Administered 2020-08-15: 200 mg via INTRAVENOUS

## 2020-08-15 MED ORDER — ONDANSETRON HCL 4 MG/2ML IJ SOLN: 4.0000 mg | INTRAMUSCULAR | Status: DC | PRN

## 2020-08-15 MED ORDER — MORPHINE SULFATE (PF) 2 MG/ML IV SOLN
INTRAVENOUS | Status: AC
  Filled 2020-08-15: qty 1

## 2020-08-15 MED ORDER — CLOPIDOGREL BISULFATE 75 MG PO TABS
75.0000 mg | ORAL_TABLET | Freq: Every day | ORAL | Status: DC
  Administered 2020-08-16: 75 mg via ORAL
  Filled 2020-08-15: qty 1

## 2020-08-15 MED ORDER — LABETALOL HCL 5 MG/ML IV SOLN
10.0000 mg | INTRAVENOUS | Status: DC | PRN
  Filled 2020-08-15: qty 8

## 2020-08-15 MED ORDER — HYDROCODONE-ACETAMINOPHEN 5-325 MG PO TABS
1.0000 | ORAL_TABLET | ORAL | Status: DC | PRN
Start: 2020-08-15 — End: 2020-08-16

## 2020-08-15 MED ORDER — CHLORHEXIDINE GLUCONATE 0.12 % MT SOLN
OROMUCOSAL | Status: AC
  Administered 2020-08-15: 15 mL via OROMUCOSAL
  Filled 2020-08-15: qty 15

## 2020-08-15 MED ORDER — FENTANYL CITRATE (PF) 250 MCG/5ML IJ SOLN
INTRAMUSCULAR | Status: AC
  Filled 2020-08-15: qty 5

## 2020-08-15 MED ORDER — HEPARIN SODIUM (PORCINE) 1000 UNIT/ML IJ SOLN
INTRAMUSCULAR | Status: DC | PRN
  Administered 2020-08-15: 5000 [IU] via INTRAVENOUS

## 2020-08-15 MED ORDER — LACTATED RINGERS IV SOLN: INTRAVENOUS | Status: DC

## 2020-08-15 MED ORDER — LIDOCAINE 2% (20 MG/ML) 5 ML SYRINGE
INTRAMUSCULAR | Status: AC
  Filled 2020-08-15: qty 5

## 2020-08-15 MED ORDER — PHENYLEPHRINE HCL-NACL 10-0.9 MG/250ML-% IV SOLN
INTRAVENOUS | Status: DC | PRN
  Administered 2020-08-15: 25 ug/min via INTRAVENOUS

## 2020-08-15 MED ORDER — ONDANSETRON HCL 4 MG PO TABS: 4.0000 mg | ORAL_TABLET | ORAL | Status: DC | PRN

## 2020-08-15 MED ORDER — PANTOPRAZOLE SODIUM 40 MG IV SOLR
40.0000 mg | Freq: Every day | INTRAVENOUS | Status: DC
  Administered 2020-08-15: 40 mg via INTRAVENOUS
  Filled 2020-08-15: qty 40

## 2020-08-15 NOTE — Sedation Documentation (Signed)
Sheath removed with 7 [place] Exocele closure device deployed. Guaze and Tegaderm placed to the right groin.

## 2020-08-15 NOTE — Transfer of Care (Signed)
Immediate Anesthesia Transfer of Care Note  Patient: Chad Ruiz.  Procedure(s) Performed: Pipeline Embolization of LVA Aneurysm (N/A )  Patient Location: PACU  Anesthesia Type:General  Level of Consciousness: drowsy and patient cooperative  Airway & Oxygen Therapy: Patient Spontanous Breathing and Patient connected to face mask oxygen  Post-op Assessment: Report given to RN and Post -op Vital signs reviewed and stable. Moving extremities x4, sticks tongue out midline  Post vital signs: Reviewed  Last Vitals:  Vitals Value Taken Time  BP 118/86 08/15/20 1502  Temp    Pulse 72 08/15/20 1505  Resp 19 08/15/20 1505  SpO2 100 % 08/15/20 1505  Vitals shown include unvalidated device data.  Last Pain:  Vitals:   08/15/20 0950  TempSrc:   PainSc: 0-No pain         Complications: No complications documented.

## 2020-08-15 NOTE — Anesthesia Postprocedure Evaluation (Signed)
Anesthesia Post Note  Patient: Chad Ruiz.  Procedure(s) Performed: Pipeline Embolization of LVA Aneurysm (N/A )     Patient location during evaluation: PACU Anesthesia Type: General Pain management: pain level controlled Vital Signs Assessment: post-procedure vital signs reviewed and stable Postop Assessment: no apparent nausea or vomiting Anesthetic complications: no   No complications documented.  Last Vitals:  Vitals:   08/15/20 1520 08/15/20 1535  BP: 118/88 113/81  Pulse: 68 67  Resp: 17 19  Temp:  (!) 36.2 C  SpO2: 97% 98%    Last Pain:  Vitals:   08/15/20 1535  TempSrc:   PainSc: 0-No pain                 CHARLENE GREEN

## 2020-08-15 NOTE — Anesthesia Procedure Notes (Signed)
Procedure Name: Intubation Date/Time: 08/15/2020 12:50 PM Performed by: Audie Pinto, CRNA Pre-anesthesia Checklist: Patient identified, Emergency Drugs available, Suction available and Patient being monitored Patient Re-evaluated:Patient Re-evaluated prior to induction Oxygen Delivery Method: Circle system utilized Preoxygenation: Pre-oxygenation with 100% oxygen Induction Type: IV induction Ventilation: Mask ventilation without difficulty Laryngoscope Size: Miller and 2 Grade View: Grade II Tube type: Oral Tube size: 7.5 mm Number of attempts: 1 Airway Equipment and Method: Stylet and Oral airway Placement Confirmation: ETT inserted through vocal cords under direct vision,  positive ETCO2 and breath sounds checked- equal and bilateral Secured at: 22 cm Tube secured with: Tape Dental Injury: Teeth and Oropharynx as per pre-operative assessment

## 2020-08-15 NOTE — Anesthesia Preprocedure Evaluation (Signed)
Anesthesia Evaluation  Patient identified by MRN, date of birth, ID band  Reviewed: Allergy & Precautions, NPO status , Patient's Chart, lab work & pertinent test results  Airway Mallampati: II  TM Distance: >3 FB     Dental   Pulmonary pneumonia,    breath sounds clear to auscultation       Cardiovascular negative cardio ROS   Rhythm:Regular Rate:Normal     Neuro/Psych    GI/Hepatic negative GI ROS, Neg liver ROS,   Endo/Other    Renal/GU Renal disease     Musculoskeletal  (+) Arthritis ,   Abdominal   Peds  Hematology   Anesthesia Other Findings   Reproductive/Obstetrics                             Anesthesia Physical Anesthesia Plan  ASA: III  Anesthesia Plan:    Post-op Pain Management:    Induction: Intravenous  PONV Risk Score and Plan: 2 and Ondansetron, Dexamethasone and Midazolam  Airway Management Planned:   Additional Equipment:   Intra-op Plan:   Post-operative Plan:   Informed Consent:   Plan Discussed with: Anesthesiologist  Anesthesia Plan Comments:         Anesthesia Quick Evaluation

## 2020-08-15 NOTE — Sedation Documentation (Signed)
Anesthesia Case, VSS, NAD, proper equipment in place for procedure.

## 2020-08-15 NOTE — H&P (Signed)
Chief Complaint   Aneurysm  HPI   HPI: Chad Ruiz. is a 50 y.o. male who was found to have a fusiform left vertebral artery aneurysm after he was involved in a work accident in which a steel frame fell on him.  He had previously undergone diagnostic cerebral angiogram for further characterization.  We were planning to treat this aneurysm with a pipeline embolization device however he needed surgery on his nose prior to it because of the need for aspirin and Plavix during the aneurysm treatment and for six months afterwards.  Now that his nose surgeries have been completed, he is ready to proceed with pipeline embolization.  He does note some continued left-sided headaches although the frequency and severity has improved somewhat over the last few months. He presents today for surgery. He is without any concerns. He has been taking aspirin 325 mg and Plavix 75 mg daily for the past 7 days in preparation for surgery today. He did not some bleeding from his nose at the site of his previous rhinoplasty, otherwise no other abnormal or spontaneous bleeding.   Patient Active Problem List   Diagnosis Date Noted  . Lateral epicondylitis of right elbow 10/25/2017  . Encounter for health maintenance examination with abnormal findings 10/25/2017  . Orchitis and epididymitis 10/25/2017  . Abnormal liver enzymes 04/13/2012  . Inguinal pain, left 10/24/2011  . Pulmonary nodule 10/24/2011    PMH: Past Medical History:  Diagnosis Date  . Brain aneurysm    takes aspirin per MD  . History of chicken pox    childhood  . History of kidney stones   . Inguinal hernia   . Kidney calculi   . Pneumonia     PSH: Past Surgical History:  Procedure Laterality Date  . ANKLE SURGERY    . APPENDECTOMY    . CLOSED REDUCTION NASAL FRACTURE N/A 10/17/2019   Procedure: CLOSED REDUCTION NASAL FRACTURE;  Surgeon: [name], MD;  Location: [facility];  Service: ENT;  Laterality: N/A;  .  FRACTURE SURGERY    . HAND SURGERY    . HERNIA REPAIR  2015  . IR ANGIO INTRA EXTRACRAN SEL INTERNAL CAROTID BILAT MOD SED  12/14/2019  . IR ANGIO VERTEBRAL SEL VERTEBRAL BILAT MOD SED  12/14/2019  . nose  1996-1998   reconstruction x 3  . SEPTOPLASTY N/A 01/23/2020   Procedure: SEPTOPLASTY;  Surgeon: [name], MD;  Location: [facility];  Service: ENT;  Laterality: N/A;  . TONSILLECTOMY  1979    Medications Prior to Admission  Medication Sig Dispense Refill Last Dose  . aspirin EC 81 MG tablet Take 81 mg by mouth daily. Swallow whole.   08/15/2020 at 0800  . clopidogrel (PLAVIX) 75 MG tablet Take 75 mg by mouth daily.   08/15/2020 at 0800    SH: Social History   Tobacco Use  . Smoking status: Never Smoker  . Smokeless tobacco: Never Used  Vaping Use  . Vaping Use: Never used  Substance Use Topics  . Alcohol use: No  . Drug use: No    MEDS: Prior to Admission medications   Medication Sig Start Date End Date Taking? Authorizing Provider  aspirin EC 81 MG tablet Take 81 mg by mouth daily. Swallow whole.    [provider]  clopidogrel (PLAVIX) 75 MG tablet Take 75 mg by mouth daily. 12/26/19   [provider]    ALLERGY: No Known Allergies  Social History  Tobacco Use  . Smoking status: Never Smoker  . Smokeless tobacco: Never Used  Substance Use Topics  . Alcohol use: No     Family History  Problem Relation Age of Onset  . Hypertension Mother   . Skin cancer Father   . Breast cancer Maternal Grandmother   . Skin cancer Paternal Grandfather   . Heart disease Paternal Grandmother 50       stent placement  . Diabetes Brother 59  . Lung cancer Paternal Uncle        smoker  . Leukemia Paternal Uncle   . Prostate cancer Neg Hx   . Colon cancer Neg Hx      ROS   ROS  Exam   Vitals:   08/15/20 0924  BP: (!) 139/96  Pulse: 78  Resp: 18  Temp: 98.4 F (36.9 C)  SpO2: 97%   General appearance: WDWN, NAD Eyes: No  scleral injection Cardiovascular: Regular rate and rhythm without murmurs, rubs, gallops. No edema or variciosities. Distal pulses normal. Pulmonary: Effort normal, non-labored breathing Musculoskeletal:     Muscle tone upper extremities: Normal    Muscle tone lower extremities: Normal    Motor exam: Upper Extremities Deltoid Bicep Tricep Grip  Right 5/5 5/5 5/5 5/5  Left 5/5 5/5 5/5 5/5   Lower Extremity IP Quad PF DF EHL  Right 5/5 5/5 5/5 5/5 5/5  Left 5/5 5/5 5/5 5/5 5/5   Neurological Mental Status:    - Patient is awake, alert, oriented to person, place, month, year, and situation    - Patient is able to give a clear and coherent history.    - No signs of aphasia or neglect Cranial Nerves    - II: Visual Fields are full. PERRL    - III/IV/VI: EOMI without ptosis or diploplia.     - V: Facial sensation is grossly normal    - VII: Facial movement is symmetric.     - VIII: hearing is intact to voice    - X: Uvula elevates symmetrically    - XI: Shoulder shrug is symmetric.    - XII: tongue is midline without atrophy or fasciculations.  Sensory: Sensation grossly intact to LT   Results - Imaging/Labs   No results found for this or any previous visit (from the past 48 hour(s)).  No results found.  IMAGING: Follow-up CT angiogram of the head was personally reviewed from January 2022. This again demonstrates the fusiform enlargement of the V4 segment of the left vertebral artery, measuring approximately 8-9 mm in maximal diameter.  This appears largely stable in comparison to the angiogram.  Impression/Plan   50 y.o. male with fusiform left vertebral artery aneurysm that will require treatment. We will proceed with pipeline embolization of the left vertebral artery aneurysm.  Patient has been taking daily aspirin and Plavix for the past 7 days in preparation to surgery today.  We have reviewed the indications for surgery, the associated risks, benefits and alternatives at  length in the office.  All questions today were answered and consent was obtained.  Lisbeth Renshaw, MD [facility]     fusiform left vertebral artery aneurysm after he was involved in a work accident in which a steel frame fell on him.

## 2020-08-15 NOTE — Brief Op Note (Signed)
  NEUROSURGERY BRIEF OPERATIVE  NOTE   PREOP DX: LVA aneurysm  POSTOP DX: Same  PROCEDURE: Diagnostic cerebral angiogram  SURGEON: Dr. Lisbeth Renshaw, MD  ANESTHESIA: GETA  EBL: Minimal  SPECIMENS: None  COMPLICATIONS: None  CONDITION: Stable to recovery  FINDINGS (Full report in [facility]): 1. Successful Pipeline Shield embolization of LVA aneurysm utilizing 5.0 x 30 and 5.0 x 32mm devices.   Lisbeth Renshaw, MD [facility]

## 2020-08-15 NOTE — Anesthesia Procedure Notes (Signed)
Arterial Line Insertion Start/End2/25/2022 12:00 PM Performed by: Audie Pinto, CRNA  Patient location: Pre-op. Preanesthetic checklist: patient identified, risks and benefits discussed and pre-op evaluation Lidocaine 1% used for infiltration Right, radial was placed Catheter size: 20 G  Attempts: 2 Procedure performed without using ultrasound guided technique. Following insertion, dressing applied and Biopatch. Post procedure assessment: normal  Patient tolerated the procedure well with no immediate complications.

## 2020-08-16 LAB — MRSA PCR SCREENING: MRSA by PCR: NEGATIVE

## 2020-08-16 MED ORDER — CLOPIDOGREL BISULFATE 75 MG PO TABS: 75.0000 mg | ORAL_TABLET | Freq: Every day | ORAL | 5 refills | Status: DC

## 2020-08-16 NOTE — Discharge Summary (Signed)
Physician Discharge Summary  Patient ID: Chad Ruiz. MRN: [id number] DOB/AGE: [birth year] 48 y.o.  Admit date: 08/15/2020 Discharge date: 08/16/2020  Admission Diagnoses: Left vertebral artery aneurysm  Discharge Diagnoses: Left vertebral artery aneurysm Active Problems:   Status post coil embolization of cerebral aneurysm   Cerebral aneurysm   Discharged Condition: good  Hospital Course: Patient was admitted to undergo placement of a flow diverter for left ventricular artery aneurysm.  This was done successfully.  Consults: None  Significant Diagnostic Studies: angiography: See report  Treatments: surgery: Cerebral angiography with placement of flow diverter  Discharge Exam: Blood pressure 117/85, pulse 76, temperature 98.5 F (36.9 C), temperature source Oral, resp. rate (!) 22, height 5\' 8"  (1.727 m), weight 80.3 kg, SpO2 95 %. Incision is clean and dry neurologic function is intact  Disposition: Discharge disposition: 01-Home or Self Care       Discharge Instructions    Call MD for:  redness, tenderness, or signs of infection (pain, swelling, redness, odor or green/yellow discharge around incision site)   Complete by: As directed    Call MD for:  severe uncontrolled pain   Complete by: As directed    Call MD for:  temperature >100.4   Complete by: As directed    Diet - low sodium heart healthy   Complete by: As directed    Discharge wound care:   Complete by: As directed    Remove dressing in 48 hours   Increase activity slowly   Complete by: As directed      Allergies as of 08/16/2020   No Known Allergies     Medication List    TAKE these medications   aspirin EC 81 MG tablet Take 81 mg by mouth daily. Swallow whole.   clopidogrel 75 MG tablet Commonly known as: PLAVIX Take 1 tablet (75 mg total) by mouth daily. Start taking on: August 17, 2020            Discharge Care Instructions  (From admission, onward)         Start      Ordered   08/16/20 0000  Discharge wound care:       Comments: Remove dressing in 48 hours   08/16/20 1110           Signed: 08/18/20 Jaidon Ellery 08/16/2020, 11:11 AM

## 2020-08-16 NOTE — Progress Notes (Signed)
Patient ID: Chad Limbo., male   DOB: 1971/04/10, 50 y.o.   MRN: [id number] Vital signs are stable Patient is awake and alert He is mobile He wishes to go home Catheter entry site looks good We will discharge home on Plavix and aspirin

## 2020-08-16 NOTE — Plan of Care (Signed)
Patient at baseline and MD has written to discharge home.

## 2020-08-17 LAB — TYPE AND SCREEN
ABO/RH(D): A POS
Antibody Screen: NEGATIVE
Unit division: 0
Unit division: 0

## 2020-08-17 LAB — BPAM RBC
Blood Product Expiration Date: 202203192359
Blood Product Expiration Date: 202203192359
Unit Type and Rh: 6200
Unit Type and Rh: 6200

## 2020-08-18 ENCOUNTER — Encounter (HOSPITAL_COMMUNITY): Payer: Self-pay | Admitting: Neurosurgery

## 2020-11-01 IMAGING — CT CT MAXILLOFACIAL W/O CM
3 of 6 series · 14 of 47 positions shown, 17 images · non-contrast
Comparison: None

CLINICAL DATA: Trauma, 300 pound metal pole/beam fell and struck
patient in head, lacerations to forehead and nose, head heaviness

EXAM:
CT HEAD WITHOUT CONTRAST
CT MAXILLOFACIAL WITHOUT CONTRAST
CT CERVICAL SPINE WITHOUT CONTRAST
TECHNIQUE: Multidetector CT imaging of the head, cervical spine, and
maxillofacial structures were performed using the standard protocol
without intravenous contrast. Multiplanar CT image reconstructions
of the cervical spine and maxillofacial structures were also
generated. Right side of face marked with BB.

[Series 3: maxilllofacial 2.0 hr59 3 · axial · 0.39mm/px · z∈[-127,+19]mm · 9 of 87 slices shown, 12 images]
[im 7/87  brain]
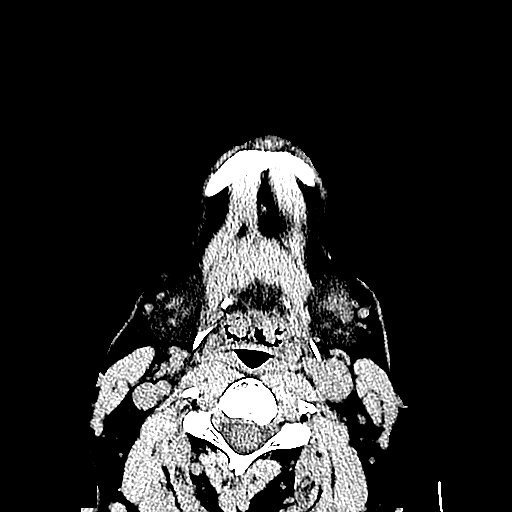
[im 7/87  bone]
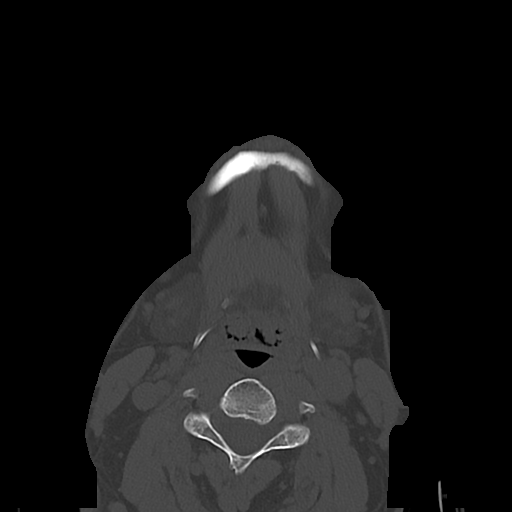
[im 19/87  bone]
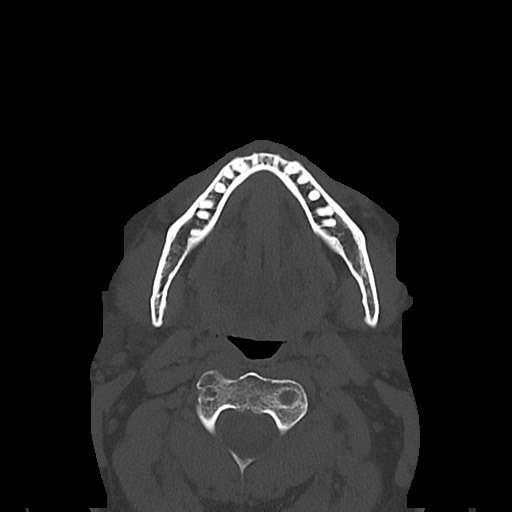
[im 25/87  bone]
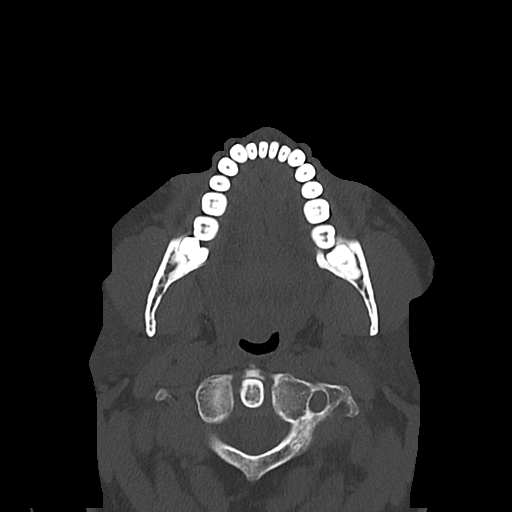
[im 37/87  bone]
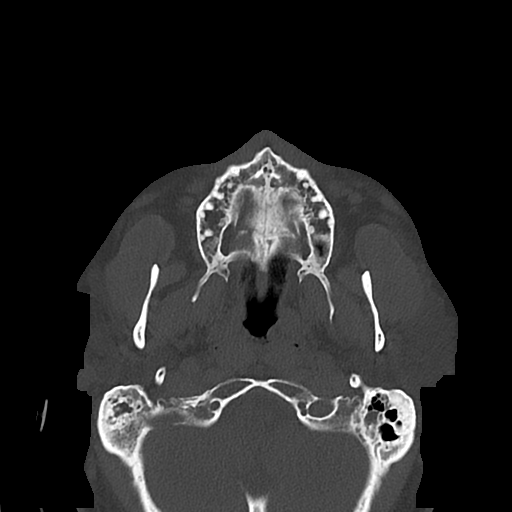
[im 44/87  brain]
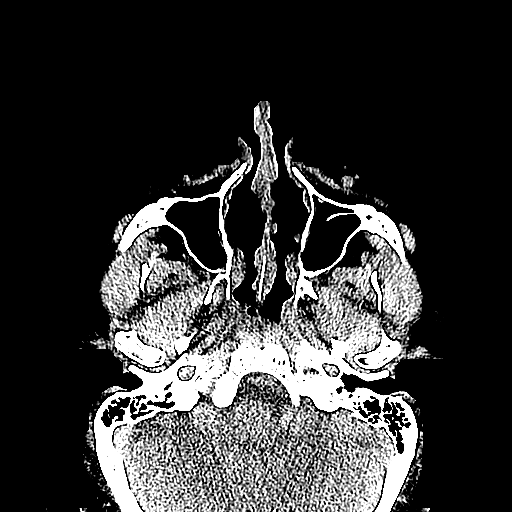
[im 44/87  bone]
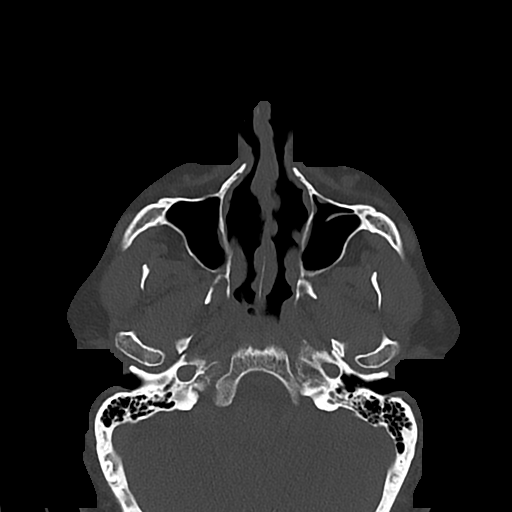
[im 50/87  bone]
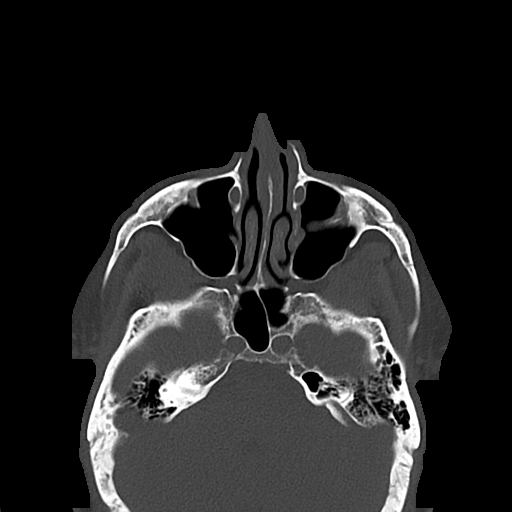
[im 62/87  bone]
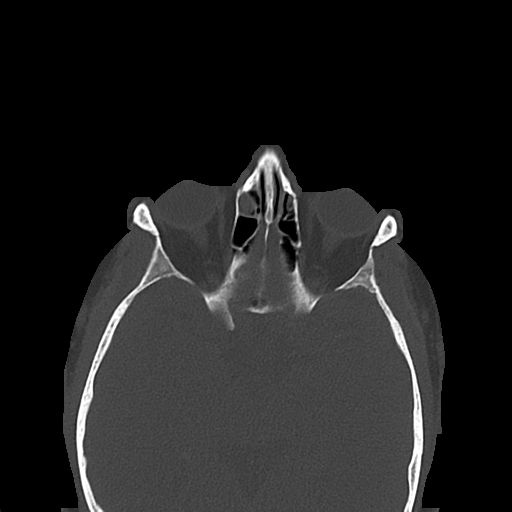
[im 68/87  bone]
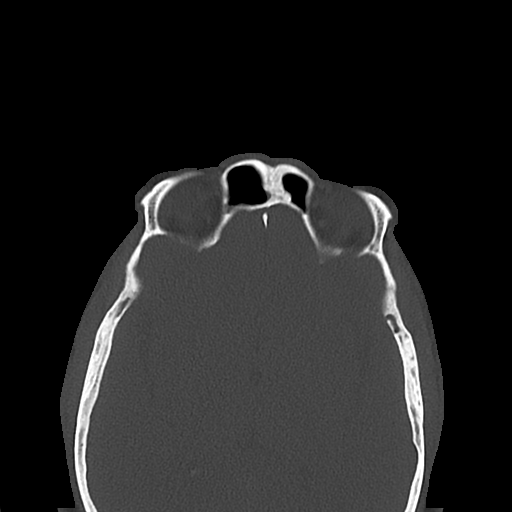
[im 80/87  brain]
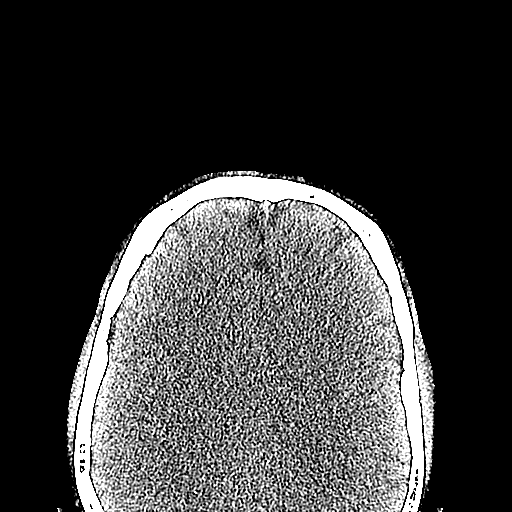
[im 80/87  bone]
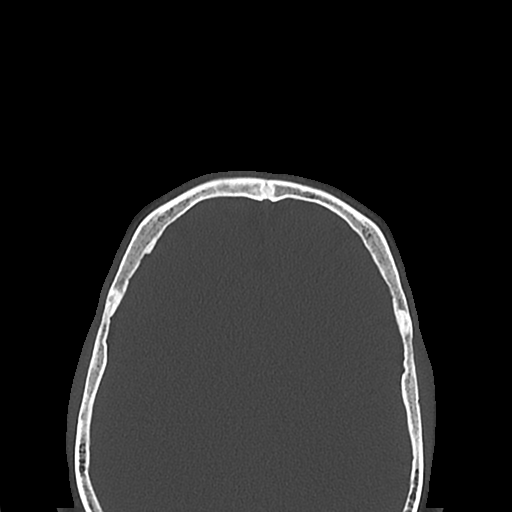

[Series 9: bone cor · coronal · 0.34mm/px · 3 of 68 slices shown]
[im 17/68  bone]
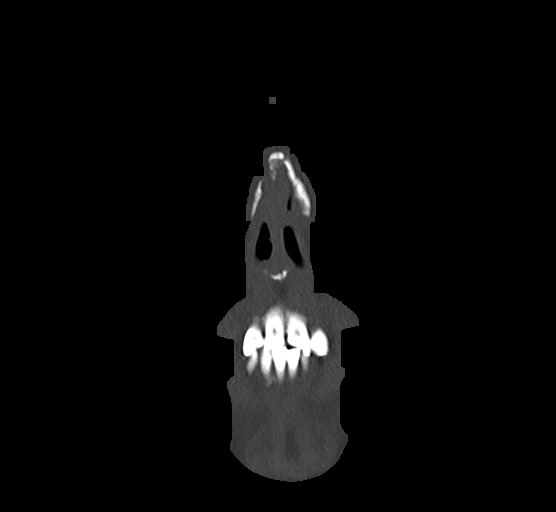
[im 34/68  bone]
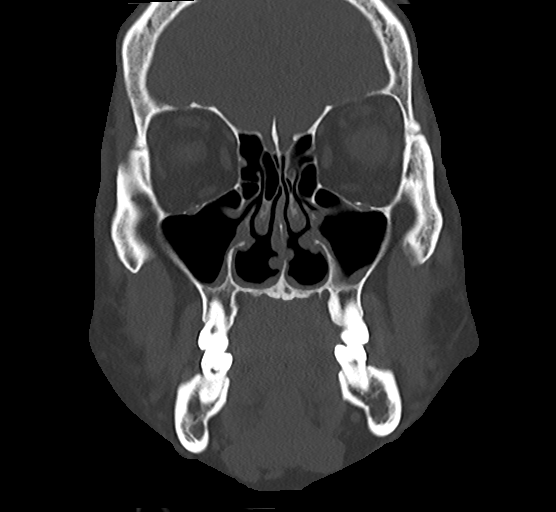
[im 51/68  bone]
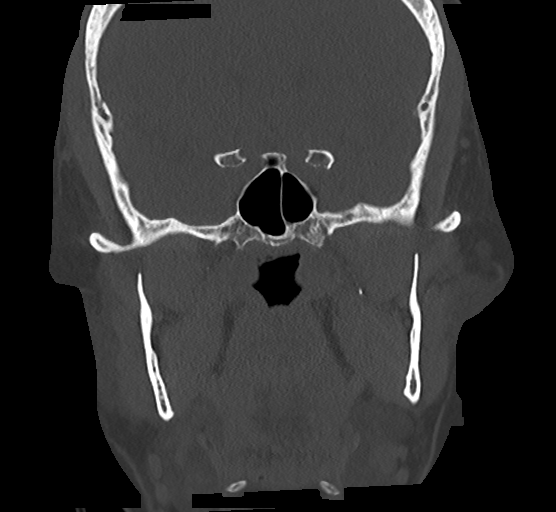

[Series 10: bone sag · sagittal · 0.32mm/px · 2 of 94 slices shown]
[im 32/94  bone]
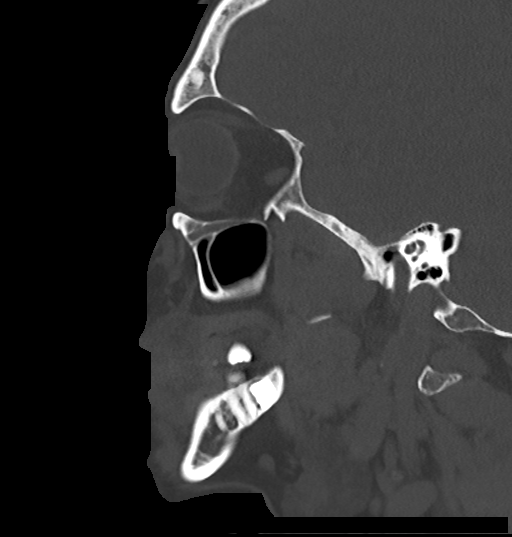
[im 63/94  bone]
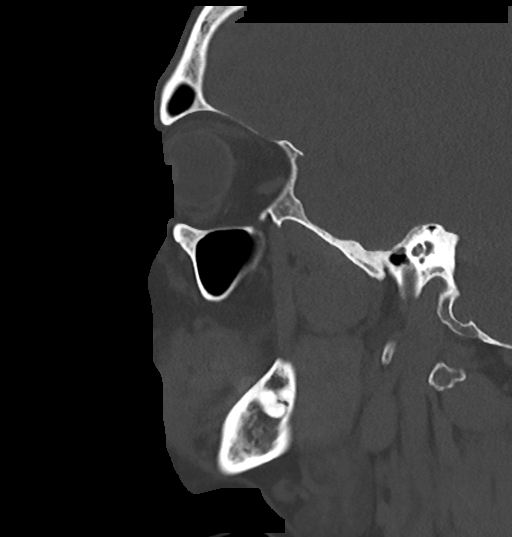

[14 of 47 positions shown; findings below may reference images not displayed]

FINDINGS: CT HEAD FINDINGS

Brain: Normal ventricular morphology. No midline shift or mass
effect. Normal appearance of brain parenchyma. No intracranial
hemorrhage, mass lesion, or evidence of acute infarction. No
extra-axial fluid collections.

Vascular: Fusiform aneurysmal dilatation of the distal LEFT
vertebral artery adjacent to the brainstem, 9 x 9 x 9 mm in size.
This aneurysm insert mild mass effect upon the LEFT lateral aspect
of the brainstem. Remaining vascular structures unremarkable. No
hyperdense vessels.

Skull: Calvaria intact

Other: N/A

CT MAXILLOFACIAL FINDINGS

Osseous: Osseous mineralization normal. TMJ alignment normal
bilaterally. Orbits, maxillary sinuses, and zygomas intact.
Minimally depressed at LEFT nasal bone fracture with an additional
nondisplaced nasal bone fracture plane more superiorly. RIGHT nasal
bone intact. Nasal septum midline. No additional facial fractures
identified.

Orbits: Bony orbits intact. Intraorbital soft tissue planes clear
without fluid or pneumatosis

Sinuses: Mild mucosal thickening in maxillary sinuses and anterior
ethmoid air cells. Remaining paranasal sinuses, mastoid air cells
and middle ear cavities clear

Soft tissues: Soft tissue swelling at nose extending to upper lip.

CT CERVICAL SPINE FINDINGS

Alignment: Normal

Skull base and vertebrae: Osseous mineralization normal. Disc space
narrowing with endplate spur formation at C6-C7. Vertebral body and
disc space heights otherwise maintained. No fracture, subluxation or
bone destruction. Visualized skull base intact.

Soft tissues and spinal canal: Prevertebral soft tissues normal
thickness. Remaining cervical soft tissues unremarkable.

Disc levels:  No specific abnormalities

Upper chest: Lung apices clear

Other: N/A
IMPRESSION: No acute intracranial abnormalities.

Fusiform aneurysmal dilatation of the distal LEFT vertebral artery
measuring 9 x 9 x 9 mm, exerting mass effect upon the LEFT lateral
aspect of the brainstem; recommend follow-up CTA assessment.

LEFT nasal bone fractures.

No additional facial bone abnormalities.

Degenerative disc disease changes C6-C7.

No acute cervical spine abnormalities.

Findings called to Araseli Aydin PA in ED on fourth [phone] at [phone]
hours.

## 2020-12-09 ENCOUNTER — Other Ambulatory Visit: Payer: Self-pay | Admitting: Neurosurgery

## 2020-12-09 DIAGNOSIS — I671 Cerebral aneurysm, nonruptured: Secondary | ICD-10-CM

## 2021-01-23 ENCOUNTER — Other Ambulatory Visit: Payer: Self-pay | Admitting: Neurosurgery

## 2021-01-23 ENCOUNTER — Other Ambulatory Visit: Payer: Self-pay

## 2021-01-23 ENCOUNTER — Encounter (HOSPITAL_COMMUNITY): Payer: Self-pay

## 2021-01-23 ENCOUNTER — Ambulatory Visit (HOSPITAL_COMMUNITY)
Admission: RE | Admit: 2021-01-23 | Discharge: 2021-01-23 | Disposition: A | Discharge status: Home / Self Care | Payer: No Typology Code available for payment source | Source: Ambulatory Visit | Attending: Neurosurgery | Admitting: Neurosurgery

## 2021-01-23 DIAGNOSIS — Z7982 Long term (current) use of aspirin: Secondary | ICD-10-CM | POA: Diagnosis not present

## 2021-01-23 DIAGNOSIS — Z7902 Long term (current) use of antithrombotics/antiplatelets: Secondary | ICD-10-CM | POA: Diagnosis not present

## 2021-01-23 DIAGNOSIS — I671 Cerebral aneurysm, nonruptured: Secondary | ICD-10-CM

## 2021-01-23 DIAGNOSIS — I726 Aneurysm of vertebral artery: Secondary | ICD-10-CM | POA: Insufficient documentation

## 2021-01-23 HISTORY — PX: IR ANGIO VERTEBRAL SEL VERTEBRAL UNI L MOD SED: IMG5367

## 2021-01-23 HISTORY — PX: IR US GUIDE VASC ACCESS RIGHT: IMG2390

## 2021-01-23 LAB — BASIC METABOLIC PANEL
Anion gap: 9 (ref 5–15)
BUN: 23 mg/dL — ABNORMAL HIGH (ref 6–20)
CO2: 24 mmol/L (ref 22–32)
Calcium: 9 mg/dL (ref 8.9–10.3)
Chloride: 108 mmol/L (ref 98–111)
Creatinine, Ser: 1.06 mg/dL (ref 0.61–1.24)
GFR, Estimated: >60 mL/min (ref >60)
Glucose, Bld: 103 mg/dL — ABNORMAL HIGH (ref 70–99)
Potassium: 4 mmol/L (ref 3.5–5.1)
Sodium: 141 mmol/L (ref 135–145)

## 2021-01-23 LAB — CBC WITH DIFFERENTIAL/PLATELET
Abs Immature Granulocytes: 0.04 10*3/uL (ref 0.00–0.07)
Basophils Absolute: 0.1 10*3/uL (ref 0.0–0.1)
Basophils Relative: 1 %
Eosinophils Absolute: 0.4 10*3/uL (ref 0.0–0.5)
Eosinophils Relative: 4 %
HCT: 49 % (ref 39.0–52.0)
Hemoglobin: 16 g/dL (ref 13.0–17.0)
Immature Granulocytes: 1 %
Lymphocytes Relative: 30 %
Lymphs Abs: 2.4 10*3/uL (ref 0.7–4.0)
MCH: 29.2 pg (ref 26.0–34.0)
MCHC: 32.7 g/dL (ref 30.0–36.0)
MCV: 89.4 fL (ref 80.0–100.0)
Monocytes Absolute: 0.7 10*3/uL (ref 0.1–1.0)
Monocytes Relative: 9 %
Neutro Abs: 4.4 10*3/uL (ref 1.7–7.7)
Neutrophils Relative %: 55 %
Platelets: 264 10*3/uL (ref 150–400)
RBC: 5.48 MIL/uL (ref 4.22–5.81)
RDW: 12.8 % (ref 11.5–15.5)
WBC: 8 10*3/uL (ref 4.0–10.5)
nRBC: 0 % (ref 0.0–0.2)

## 2021-01-23 LAB — PROTIME-INR
INR: 1 (ref 0.8–1.2)
Prothrombin Time: 12.9 seconds (ref 11.4–15.2)

## 2021-01-23 MED ORDER — FENTANYL CITRATE (PF) 100 MCG/2ML IJ SOLN
INTRAMUSCULAR | Status: AC
  Filled 2021-01-23: qty 2

## 2021-01-23 MED ORDER — CEFAZOLIN SODIUM-DEXTROSE 2-4 GM/100ML-% IV SOLN: 2.0000 g | INTRAVENOUS | Status: DC

## 2021-01-23 MED ORDER — CHLORHEXIDINE GLUCONATE CLOTH 2 % EX PADS: 6.0000 | MEDICATED_PAD | Freq: Once | CUTANEOUS | Status: DC

## 2021-01-23 MED ORDER — HEPARIN SODIUM (PORCINE) 1000 UNIT/ML IJ SOLN
INTRAMUSCULAR | Status: AC | PRN
  Administered 2021-01-23: 2000 [IU] via INTRAVENOUS

## 2021-01-23 MED ORDER — MIDAZOLAM HCL 2 MG/2ML IJ SOLN
INTRAMUSCULAR | Status: AC | PRN
  Administered 2021-01-23: 1 mg via INTRAVENOUS

## 2021-01-23 MED ORDER — LIDOCAINE HCL 1 % IJ SOLN
INTRAMUSCULAR | Status: AC
  Filled 2021-01-23: qty 20

## 2021-01-23 MED ORDER — IOHEXOL 300 MG/ML  SOLN
50.0000 mL | Freq: Once | INTRAMUSCULAR | Status: AC | PRN
  Administered 2021-01-23: 15 mL via INTRA_ARTERIAL

## 2021-01-23 MED ORDER — HEPARIN SODIUM (PORCINE) 1000 UNIT/ML IJ SOLN
INTRAMUSCULAR | Status: AC
  Filled 2021-01-23: qty 1

## 2021-01-23 MED ORDER — HYDROCODONE-ACETAMINOPHEN 5-325 MG PO TABS: 1.0000 | ORAL_TABLET | ORAL | Status: DC | PRN

## 2021-01-23 MED ORDER — LIDOCAINE HCL (PF) 1 % IJ SOLN
INTRAMUSCULAR | Status: AC | PRN
  Administered 2021-01-23: 10 mL

## 2021-01-23 MED ORDER — FENTANYL CITRATE (PF) 100 MCG/2ML IJ SOLN
INTRAMUSCULAR | Status: AC | PRN
  Administered 2021-01-23: 25 ug via INTRAVENOUS

## 2021-01-23 MED ORDER — MIDAZOLAM HCL 2 MG/2ML IJ SOLN
INTRAMUSCULAR | Status: AC
  Filled 2021-01-23: qty 4

## 2021-01-23 MED ORDER — SODIUM CHLORIDE 0.9 % IV SOLN: INTRAVENOUS | Status: DC

## 2021-01-23 NOTE — H&P (Signed)
  Chief Complaint   Aneurysm  History of Present Illness  Chad Ruiz. is a 50 y.o. male with a history of previously discovered left vertebral artery aneurysm.  Patient underwent pipeline embolization approximately 6 months ago and has been maintained on dual antiplatelet therapy.  He comes in today for routine short-term follow-up angiogram.  Past Medical History   Past Medical History:  Diagnosis Date   Brain aneurysm    takes aspirin per MD   History of chicken pox    childhood   History of kidney stones    Inguinal hernia    Kidney calculi    Pneumonia     Past Surgical History   Past Surgical History:  Procedure Laterality Date   ANKLE SURGERY     APPENDECTOMY     CLOSED REDUCTION NASAL FRACTURE N/A 10/17/2019   Procedure: CLOSED REDUCTION NASAL FRACTURE;  Surgeon: [name], MD;  Location: [facility];  Service: ENT;  Laterality: N/A;   FRACTURE SURGERY     HAND SURGERY     HERNIA REPAIR  2015   IR ANGIO INTRA EXTRACRAN SEL INTERNAL CAROTID BILAT MOD SED  12/14/2019   IR ANGIO VERTEBRAL SEL VERTEBRAL BILAT MOD SED  12/14/2019   IR TRANSCATH/EMBOLIZ  08/15/2020   nose  1996-1998   reconstruction x 3   RADIOLOGY WITH ANESTHESIA N/A 08/15/2020   Procedure: Pipeline Embolization of LVA Aneurysm;  Surgeon: Lisbeth Renshaw, MD;  Location: [facility] OR;  Service: Radiology;  Laterality: N/A;   SEPTOPLASTY N/A 01/23/2020   Procedure: SEPTOPLASTY;  Surgeon: [name], MD;  Location: [facility];  Service: ENT;  Laterality: N/A;   TONSILLECTOMY  1979    Social History   Social History   Tobacco Use   Smoking status: Never   Smokeless tobacco: Never  Vaping Use   Vaping Use: Never used  Substance Use Topics   Alcohol use: No   Drug use: No    Medications   Prior to Admission medications   Medication Sig Start Date End Date Taking? Authorizing Provider  aspirin EC 81 MG tablet Take 81 mg by mouth daily. Swallow whole.   Yes  [provider]  clopidogrel (PLAVIX) 75 MG tablet Take 1 tablet (75 mg total) by mouth daily. 08/17/20  Yes Barnett Abu, MD    Allergies  No Known Allergies  Review of Systems  ROS  Neurologic Exam  Awake, alert, oriented Memory and concentration grossly intact Speech fluent, appropriate CN grossly intact Motor exam: Upper Extremities Deltoid Bicep Tricep Grip  Right 5/5 5/5 5/5 5/5  Left 5/5 5/5 5/5 5/5   Lower Extremities IP Quad PF DF EHL  Right 5/5 5/5 5/5 5/5 5/5  Left 5/5 5/5 5/5 5/5 5/5   Sensation grossly intact to LT   Impression  - 50 y.o. male 6 months status post pipeline embolization of a left vertebral artery aneurysm  Plan  -We will proceed with diagnostic cerebral angiogram  I have reviewed the indications for the procedure as well as the expected postoperative course and recovery.  We have discussed the risks of the procedure.  All his questions today were answered.  He provided informed consent to proceed.  Lisbeth Renshaw, MD [facility]

## 2021-01-23 NOTE — Discharge Instructions (Addendum)
Discontinue plavix.  Take full dose (325mg ) aspirin once a day for the next 6 months.

## 2021-01-23 NOTE — Brief Op Note (Signed)
  NEUROSURGERY BRIEF OPERATIVE  NOTE   PREOP DX: Left vertebral artery aneurysm  POSTOP DX: Same  PROCEDURE: Diagnostic cerebral angiogram  SURGEON: Dr. Lisbeth Renshaw, MD  ANESTHESIA: IV Sedation with Local  EBL: Minimal  SPECIMENS: None  COMPLICATIONS: None  CONDITION: Stable to recovery  FINDINGS (Full report in [facility]): 1. Complete occlusion of previously diagnosed left vertebral artery aneurysm. No in-stent stenosis.   Lisbeth Renshaw, MD [facility]

## 2021-04-28 ENCOUNTER — Other Ambulatory Visit: Payer: Self-pay

## 2021-04-28 ENCOUNTER — Encounter: Payer: Self-pay | Admitting: Family Medicine

## 2021-04-28 ENCOUNTER — Ambulatory Visit (INDEPENDENT_AMBULATORY_CARE_PROVIDER_SITE_OTHER): Payer: Managed Care, Other (non HMO) | Admitting: Family Medicine

## 2021-04-28 VITALS — BP 120/88 | HR 109 | Temp 97.2°F | Ht 68.0 in | Wt 174.8 lb

## 2021-04-28 DIAGNOSIS — K120 Recurrent oral aphthae: Secondary | ICD-10-CM

## 2021-04-28 DIAGNOSIS — N481 Balanitis: Secondary | ICD-10-CM

## 2021-04-28 DIAGNOSIS — B360 Pityriasis versicolor: Secondary | ICD-10-CM | POA: Diagnosis not present

## 2021-04-28 MED ORDER — FLUCONAZOLE 150 MG PO TABS: ORAL_TABLET | ORAL | 0 refills | Status: DC

## 2021-04-28 MED ORDER — TRIAMCINOLONE ACETONIDE 0.1 % MT PSTE: PASTE | OROMUCOSAL | 4 refills | Status: DC

## 2021-04-28 MED ORDER — HYDROCORTISONE VALERATE 0.2 % EX CREA: TOPICAL_CREAM | CUTANEOUS | 0 refills | Status: DC

## 2021-04-28 NOTE — Progress Notes (Signed)
Established Patient Office Visit  Subjective:  Patient ID: Chad Ruiz Chad Schliep Jr., male    DOB: 08/20/1970  Age: 50 y.o. MRN: [id number]  CC:  Chief Complaint  Patient presents with   Mouth Lesions    Sores in mouth, dry skin in private areas.     HPI Chad Chad Milda SmartBowman Jr. presents for evaluation and treatment of Chad 2 to 839-month history of recurrent sores in his mouth.  First occurred in the room of his mouth and resolved.  There is been an ongoing lesion at the base of his tongue on the left.  It is burning and sore.  Spicy foods irritated.  It has not been treated.  Also has Chad scaly rash on the glans of his penis.  He is in Chad stable relationship.  Ongoing history of of light areas on his back and chest.  They come and go.  Possibly worse in the warmer months.  Past Medical History:  Diagnosis Date   Brain aneurysm    takes aspirin per MD   History of chicken pox    childhood   History of kidney stones    Inguinal hernia    Kidney calculi    Pneumonia     Past Surgical History:  Procedure Laterality Date   ANKLE SURGERY     APPENDECTOMY     CLOSED REDUCTION NASAL FRACTURE N/Chad 10/17/2019   Procedure: CLOSED REDUCTION NASAL FRACTURE;  Surgeon: Serena Colonelosen, Jefry, MD;  Location: [facility];  Service: ENT;  Laterality: N/Chad;   FRACTURE SURGERY     HAND SURGERY     HERNIA REPAIR  2015   IR ANGIO INTRA EXTRACRAN SEL INTERNAL CAROTID BILAT MOD SED  12/14/2019   IR ANGIO VERTEBRAL SEL VERTEBRAL BILAT MOD SED  12/14/2019   IR ANGIO VERTEBRAL SEL VERTEBRAL UNI L MOD SED  01/23/2021   IR TRANSCATH/EMBOLIZ  08/15/2020   IR US GUIDE VASC ACCESS RIGHT  01/23/2021   nose  1996-1998   reconstruction x 3   RADIOLOGY WITH ANESTHESIA N/Chad 08/15/2020   Procedure: Pipeline Embolization of LVA Aneurysm;  Surgeon: Lisbeth RenshawNundkumar, Neelesh, MD;  [facility];  Service: Radiology;  Laterality: N/Chad;   SEPTOPLASTY N/Chad 01/23/2020   Procedure: SEPTOPLASTY;  Surgeon: Serena Colonelosen, Jefry, MD;  Location: [facility];  Service: ENT;  Laterality: N/Chad;   TONSILLECTOMY  1979    Family History  Problem Relation Age of Onset   Hypertension Mother    Skin cancer Father    Breast cancer Maternal Grandmother    Skin cancer Paternal Grandfather    Heart disease Paternal Grandmother [id number]       stent placement   Diabetes Brother 17   Lung cancer Paternal Uncle        smoker   Leukemia Paternal Uncle    Prostate cancer Neg Hx    Colon cancer Neg Hx     Social History   Socioeconomic History   Marital status: Single    Spouse name: Not on file   Number of children: 0   Years of education: Not on file   Highest education level: Not on file  Occupational History   Occupation: truck Air traffic controllerdriver    Employer: [employer]  Tobacco Use   Smoking status: Never   Smokeless tobacco: Never  Vaping Use   Vaping Use: Never used  Substance and Sexual Activity   Alcohol use: No   Drug use: No   Sexual activity: Yes  Other Topics Concern   Not on file  Social History Narrative   Not on file   Social Determinants of Health   Financial Resource Strain: Not on file  Food Insecurity: Not on file  Transportation Needs: Not on file  Physical Activity: Not on file  Stress: Not on file  Social Connections: Not on file  Intimate Partner Violence: Not on file    Outpatient Medications Prior to Visit  Medication Sig Dispense Refill   aspirin EC 81 MG tablet Take 81 mg by mouth daily. Swallow whole.     aspirin-acetaminophen-caffeine (EXCEDRIN MIGRAINE) 250-250-65 MG tablet Take by mouth.     clopidogrel (PLAVIX) 75 MG tablet Take 1 tablet (75 mg total) by mouth daily. (Patient not taking: Reported on 04/28/2021) 30 tablet 5   No facility-administered medications prior to visit.    No Known Allergies  ROS Review of Systems  Constitutional:  Negative for chills, diaphoresis, fatigue, fever and unexpected weight change.  HENT:  Negative for congestion, postnasal drip and sore throat.   Eyes:   Negative for photophobia and visual disturbance.  Respiratory:  Negative for cough.   Gastrointestinal:  Negative for nausea and vomiting.  Genitourinary:  Negative for dysuria and frequency.  Skin:  Positive for color change and rash. Negative for wound.  Psychiatric/Behavioral: Negative.       Objective:    Physical Exam Vitals and nursing note reviewed.  Constitutional:      General: He is not in acute distress.    Appearance: Normal appearance. He is normal weight. He is not ill-appearing, toxic-appearing or diaphoretic.  HENT:     Head: Normocephalic and atraumatic.     Right Ear: Tympanic membrane, ear canal and external ear normal.     Left Ear: Tympanic membrane, ear canal and external ear normal.     Mouth/Throat:     Mouth: Mucous membranes are moist.     Pharynx: Oropharynx is clear. No oropharyngeal exudate or posterior oropharyngeal erythema.   Eyes:     General: No scleral icterus.       Right eye: No discharge.        Left eye: No discharge.     Extraocular Movements: Extraocular movements intact.     Conjunctiva/sclera: Conjunctivae normal.     Pupils: Pupils are equal, round, and reactive to light.  Cardiovascular:     Rate and Rhythm: Normal rate and regular rhythm.  Pulmonary:     Effort: Pulmonary effort is normal.     Breath sounds: Normal breath sounds.  Abdominal:     General: Bowel sounds are normal.  Musculoskeletal:     Cervical back: No rigidity or tenderness.  Lymphadenopathy:     Cervical: No cervical adenopathy.  Skin:    General: Skin is warm and dry.       Neurological:     Mental Status: He is alert and oriented to person, place, and time.  Psychiatric:        Mood and Affect: Mood normal.        Behavior: Behavior normal.    BP 120/88 (BP Location: Right Arm, Patient Position: Sitting, Cuff Size: Normal)   Pulse (!) 109   Temp (!) 97.2 F (36.2 C) (Temporal)   Ht 5\' 8"  (1.727 m)   Wt 174 lb 12.8 oz (79.3 kg)   SpO2 95%   BMI  26.58 kg/m  Wt Readings from Last 3 Encounters:  04/28/21 174 lb 12.8 oz (79.3 kg)  01/23/21  175 lb (79.4 kg)  08/15/20 177 lb (80.3 kg)     Health Maintenance Due  Topic Date Due   Hepatitis C Screening  Never done    There are no preventive care reminders to display for this patient.  Lab Results  Component Value Date   TSH 1.53 02/22/2020   Lab Results  Component Value Date   WBC 8.0 01/23/2021   HGB 16.0 01/23/2021   HCT 49.0 01/23/2021   MCV 89.4 01/23/2021   PLT 264 01/23/2021   Lab Results  Component Value Date   NA 141 01/23/2021   K 4.0 01/23/2021   CO2 24 01/23/2021   GLUCOSE 103 (H) 01/23/2021   BUN 23 (H) 01/23/2021   CREATININE 1.06 01/23/2021   BILITOT 0.6 02/22/2020   ALKPHOS 58 10/25/2017   AST 17 02/22/2020   ALT 26 02/22/2020   PROT 7.3 02/22/2020   ALBUMIN 4.1 10/25/2017   CALCIUM 9.0 01/23/2021   ANIONGAP 9 01/23/2021   GFR 77.84 10/25/2017   Lab Results  Component Value Date   CHOL 258 (H) 02/22/2020   Lab Results  Component Value Date   HDL 51 02/22/2020   Lab Results  Component Value Date   LDLCALC 174 (H) 02/22/2020   Lab Results  Component Value Date   TRIG 173 (H) 02/22/2020   Lab Results  Component Value Date   CHOLHDL 5.1 (H) 02/22/2020   No results found for: HGBA1C    Assessment & Plan:   Problem List Items Addressed This Visit   None Visit Diagnoses     Balanitis    -  Primary   Relevant Medications   hydrocortisone valerate cream (WESTCORT) 0.2 %   Aphthous ulcer of mouth       Relevant Medications   triamcinolone (KENALOG) 0.1 % paste   Tinea versicolor       Relevant Medications   fluconazole (DIFLUCAN) 150 MG tablet       Meds ordered this encounter  Medications   triamcinolone (KENALOG) 0.1 % paste    Sig: Apply Chad small dab to sore in mouth twice daily.    Dispense:  5 g    Refill:  4   hydrocortisone valerate cream (WESTCORT) 0.2 %    Sig: Apply Chad thin coating twice daily to rash in  genital area.    Dispense:  45 g    Refill:  0   fluconazole (DIFLUCAN) 150 MG tablet    Sig: Take 2 po x 1 and then repeat once in 1 week.    Dispense:  4 tablet    Refill:  0    Follow-up: Return in about 1 month (around 05/28/2021), or ENT referral if mouth sore is not healing in Chad few weeks..  Patient was given information on aphthous ulcers and tenia versicolor.   Libby Maw, MD

## 2021-06-17 ENCOUNTER — Encounter: Payer: Self-pay | Admitting: Family

## 2021-06-17 ENCOUNTER — Telehealth (INDEPENDENT_AMBULATORY_CARE_PROVIDER_SITE_OTHER): Payer: Managed Care, Other (non HMO) | Admitting: Family

## 2021-06-17 VITALS — Temp 96.2°F | Ht 68.0 in | Wt 174.0 lb

## 2021-06-17 DIAGNOSIS — U071 COVID-19: Secondary | ICD-10-CM

## 2021-06-17 DIAGNOSIS — R112 Nausea with vomiting, unspecified: Secondary | ICD-10-CM

## 2021-06-17 MED ORDER — PROMETHAZINE-DM 6.25-15 MG/5ML PO SYRP: 5.0000 mL | ORAL_SOLUTION | Freq: Four times a day (QID) | ORAL | 0 refills | Status: DC | PRN

## 2021-06-17 NOTE — Progress Notes (Signed)
° °  Virtual Visit via Video   I connected with patient on 06/17/21 at  9:00 AM EST by a video enabled telemedicine application and verified that I am speaking with the correct person using two identifiers.  Location patient: Home Location provider: [employer], Office Persons participating in the virtual visit: Patient, Provider, CMA  I discussed the limitations of evaluation and management by telemedicine and the availability of in person appointments. The patient expressed understanding and agreed to proceed.  Subjective:   HPI:  50 year old unvaccinated male presents via video visit with symptoms of runny nose, congestion, nausea, diarrhea, and fever that started 1 week ago. He tested positive for COVID-19 2 days ago. His om and dad both have been sick with COVID. Has been taking Mucinex that helps some.  ROS:   See pertinent positives and negatives per HPI.  Patient Active Problem List   Diagnosis Date Noted   Status post coil embolization of cerebral aneurysm 08/15/2020   Cerebral aneurysm 08/15/2020   Lateral epicondylitis of right elbow 10/25/2017   Encounter for health maintenance examination with abnormal findings 10/25/2017   Orchitis and epididymitis 10/25/2017   Abnormal liver enzymes 04/13/2012   Inguinal pain, left 10/24/2011   Pulmonary nodule 10/24/2011    Social History   Tobacco Use   Smoking status: Never   Smokeless tobacco: Never  Substance Use Topics   Alcohol use: No    Current Outpatient Medications:    aspirin EC 81 MG tablet, Take 81 mg by mouth daily. Swallow whole., Disp: , Rfl:    aspirin-acetaminophen-caffeine (EXCEDRIN MIGRAINE) 250-250-65 MG tablet, Take by mouth., Disp: , Rfl:    promethazine-dextromethorphan (PROMETHAZINE-DM) 6.25-15 MG/5ML syrup, Take 5 mLs by mouth 4 (four) times daily as needed., Disp: 118 mL, Rfl: 0   fluconazole (DIFLUCAN) 150 MG tablet, Take 2 po x 1 and then repeat once in 1 week. (Patient not taking:  Reported on 06/17/2021), Disp: 4 tablet, Rfl: 0   hydrocortisone valerate cream (WESTCORT) 0.2 %, Apply a thin coating twice daily to rash in genital area. (Patient not taking: Reported on 06/17/2021), Disp: 45 g, Rfl: 0   triamcinolone (KENALOG) 0.1 % paste, Apply a small dab to sore in mouth twice daily. (Patient not taking: Reported on 06/17/2021), Disp: 5 g, Rfl: 4  No Known Allergies  Objective:   Temp (!) 96.2 F (35.7 C) (Oral)    Ht 5\' 8"  (1.727 m)    Wt 174 lb (78.9 kg)    HC 96" (243.8 cm)    BMI 26.46 kg/m   Patient is well-developed, well-nourished in no acute distress.  Resting comfortably at home.  Head is normocephalic, atraumatic.  No labored breathing.  Speech is clear and coherent with logical content.  Patient is alert and oriented at baseline.    Assessment and Plan:   Floy was seen today for acute visit.  Diagnoses and all orders for this visit:  COVID-19  Nausea and vomiting, unspecified vomiting type  Other orders -     promethazine-dextromethorphan (PROMETHAZINE-DM) 6.25-15 MG/5ML syrup; Take 5 mLs by mouth 4 (four) times daily as needed.    Call the office if symptoms worsen or persist. COVID-19 isolation precautions provided. Recheck as scheduled and sooner as needed.   08-23-1990, FNP 06/17/2021

## 2023-05-02 ENCOUNTER — Encounter: Payer: Self-pay | Admitting: Internal Medicine

## 2023-11-16 ENCOUNTER — Encounter: Payer: Self-pay | Admitting: Nurse Practitioner

## 2023-11-16 ENCOUNTER — Ambulatory Visit (INDEPENDENT_AMBULATORY_CARE_PROVIDER_SITE_OTHER): Admitting: Nurse Practitioner

## 2023-11-16 VITALS — BP 126/86 | HR 99 | Temp 97.8°F | Ht 68.0 in | Wt 173.8 lb

## 2023-11-16 DIAGNOSIS — J22 Unspecified acute lower respiratory infection: Secondary | ICD-10-CM | POA: Diagnosis not present

## 2023-11-16 LAB — POC COVID19 BINAXNOW: SARS Coronavirus 2 Ag: NEGATIVE

## 2023-11-16 MED ORDER — PREDNISONE 20 MG PO TABS: 40.0000 mg | ORAL_TABLET | Freq: Every day | ORAL | 0 refills | Status: DC

## 2023-11-16 MED ORDER — AZITHROMYCIN 250 MG PO TABS: ORAL_TABLET | ORAL | 0 refills | Status: AC

## 2023-11-16 MED ORDER — PROMETHAZINE-DM 6.25-15 MG/5ML PO SYRP: 5.0000 mL | ORAL_SOLUTION | Freq: Four times a day (QID) | ORAL | 0 refills | Status: DC | PRN

## 2023-11-16 NOTE — Assessment & Plan Note (Signed)
 He presents with cough, congestion, rhinorrhea, fever, and headaches. Covid-19 negative. Start promethazine  DM every four hours as needed for cough. Start prednisone, 40mg  once daily in the morning with food, beginning tomorrow. Prescribe azithromycin (Z-Pak), two tablets today, then one tablet daily for four days. Advise continuation of nasal saline rinses and encourage adequate fluid intake. Work note given.

## 2023-11-16 NOTE — Patient Instructions (Signed)
 It was great to see you!  Keep drinking plenty of fluids and you can do nasal rinses  Start prednisone 2 tablets once a day in the morning with food  Start zpak 2 tablets today, then one tablet daily for 4 days   Start cough syrup every 4 hours as needed - this may make you sleepy   Let's follow-up if your symptoms worsen or don't improve  Take care,  Rheba Cedar, NP

## 2023-11-16 NOTE — Progress Notes (Signed)
 Acute Office Visit  Subjective:     Patient ID: Chad Coca., male    DOB: Nov 02, 1970, 53 y.o.   MRN: [id number]  Chief Complaint  Patient presents with   Acute Visit    Has been having cough fever runny nose and chest congestion all started this past Friday     HPI Discussed the use of AI scribe software for clinical note transcription with the patient, who gave verbal consent to proceed.  History of Present Illness   Chad Samek. is a 53 year old male who presents with cough, congestion, and fever.  Symptoms began with a dry throat and runny nose, progressing to cough, increased congestion, and fever by Sunday. Cough is productive with phlegm in the chest, causing shortness of breath during episodes. Headaches are mild and frontal. A scratchy throat is present, likely due to coughing. No chest pain, tightness, or wheezing. Over-the-counter medications include Robitussin, Mucinex, vitamin C, zinc , and ibuprofen . He stays hydrated with water and vitamins and performs nasal flushes with salt water. He avoids contact with his girlfriend to prevent transmission. He feels weak, especially when driving, and experiences body aches with fever. No allergies to medications. He does not smoke, drink alcohol, or consume caffeine .      ROS See pertinent positives and negatives per HPI.     Objective:    BP 126/86 (BP Location: Right Arm, Cuff Size: Normal)   Pulse 99   Temp 97.8 F (36.6 C) (Temporal)   Ht 5\' 8"  (1.727 m)   Wt 173 lb 12.8 oz (78.8 kg)   SpO2 98%   BMI 26.43 kg/m    Physical Exam Vitals and nursing note reviewed.  Constitutional:      Appearance: Normal appearance.  HENT:     Head: Normocephalic.     Right Ear: Tympanic membrane, ear canal and external ear normal.     Left Ear: Tympanic membrane, ear canal and external ear normal.     Nose:     Right Sinus: No maxillary sinus tenderness or frontal sinus tenderness.     Left Sinus: No maxillary sinus  tenderness or frontal sinus tenderness.     Mouth/Throat:     Mouth: Mucous membranes are moist.     Pharynx: Posterior oropharyngeal erythema present. No oropharyngeal exudate.  Eyes:     Conjunctiva/sclera: Conjunctivae normal.  Cardiovascular:     Rate and Rhythm: Normal rate and regular rhythm.     Pulses: Normal pulses.     Heart sounds: Normal heart sounds.  Pulmonary:     Effort: Pulmonary effort is normal.     Breath sounds: Normal breath sounds.  Musculoskeletal:     Cervical back: Normal range of motion and neck supple. Tenderness present.  Lymphadenopathy:     Cervical: No cervical adenopathy.  Skin:    General: Skin is warm.  Neurological:     General: No focal deficit present.     Mental Status: He is alert and oriented to person, place, and time.  Psychiatric:        Mood and Affect: Mood normal.        Behavior: Behavior normal.        Thought Content: Thought content normal.        Judgment: Judgment normal.     Results for orders placed or performed in visit on 11/16/23  POC COVID-19 BinaxNow  Result Value Ref Range   SARS Coronavirus 2 Ag Negative Negative  Assessment & Plan:   Problem List Items Addressed This Visit       Respiratory   Lower respiratory infection - Primary   He presents with cough, congestion, rhinorrhea, fever, and headaches. Covid-19 negative. Start promethazine  DM every four hours as needed for cough. Start prednisone, 40mg  once daily in the morning with food, beginning tomorrow. Prescribe azithromycin (Z-Pak), two tablets today, then one tablet daily for four days. Advise continuation of nasal saline rinses and encourage adequate fluid intake. Work note given.       Relevant Medications   azithromycin (ZITHROMAX) 250 MG tablet   Other Relevant Orders   POC COVID-19 BinaxNow (Completed)    Meds ordered this encounter  Medications   predniSONE (DELTASONE) 20 MG tablet    Sig: Take 2 tablets (40 mg total) by mouth  daily with breakfast.    Dispense:  10 tablet    Refill:  0   promethazine -dextromethorphan (PROMETHAZINE -DM) 6.25-15 MG/5ML syrup    Sig: Take 5 mLs by mouth 4 (four) times daily as needed.    Dispense:  118 mL    Refill:  0   azithromycin (ZITHROMAX) 250 MG tablet    Sig: Take 2 tablets on day 1, then 1 tablet daily on days 2 through 5    Dispense:  6 tablet    Refill:  0    Return if symptoms worsen or fail to improve.  Odette Benjamin, NP

## 2023-11-21 ENCOUNTER — Ambulatory Visit (INDEPENDENT_AMBULATORY_CARE_PROVIDER_SITE_OTHER): Admitting: Family Medicine

## 2023-11-21 VITALS — BP 122/82 | HR 80 | Temp 97.6°F | Ht 68.0 in | Wt 172.2 lb

## 2023-11-21 DIAGNOSIS — Z1159 Encounter for screening for other viral diseases: Secondary | ICD-10-CM

## 2023-11-21 DIAGNOSIS — Z131 Encounter for screening for diabetes mellitus: Secondary | ICD-10-CM | POA: Diagnosis not present

## 2023-11-21 DIAGNOSIS — K4090 Unilateral inguinal hernia, without obstruction or gangrene, not specified as recurrent: Secondary | ICD-10-CM | POA: Diagnosis not present

## 2023-11-21 DIAGNOSIS — Z13220 Encounter for screening for lipoid disorders: Secondary | ICD-10-CM

## 2023-11-21 DIAGNOSIS — M545 Low back pain, unspecified: Secondary | ICD-10-CM

## 2023-11-21 DIAGNOSIS — R7401 Elevation of levels of liver transaminase levels: Secondary | ICD-10-CM

## 2023-11-21 DIAGNOSIS — Z0001 Encounter for general adult medical examination with abnormal findings: Secondary | ICD-10-CM

## 2023-11-21 DIAGNOSIS — Z125 Encounter for screening for malignant neoplasm of prostate: Secondary | ICD-10-CM

## 2023-11-21 DIAGNOSIS — D229 Melanocytic nevi, unspecified: Secondary | ICD-10-CM

## 2023-11-21 DIAGNOSIS — Z0000 Encounter for general adult medical examination without abnormal findings: Secondary | ICD-10-CM

## 2023-11-21 DIAGNOSIS — L989 Disorder of the skin and subcutaneous tissue, unspecified: Secondary | ICD-10-CM

## 2023-11-21 DIAGNOSIS — Z1211 Encounter for screening for malignant neoplasm of colon: Secondary | ICD-10-CM

## 2023-11-21 LAB — CBC WITH DIFFERENTIAL/PLATELET
Basophils Absolute: 0.1 10*3/uL (ref 0.0–0.1)
Basophils Relative: 0.7 % (ref 0.0–3.0)
Eosinophils Absolute: 0.1 10*3/uL (ref 0.0–0.7)
Eosinophils Relative: 1.2 % (ref 0.0–5.0)
HCT: 47.2 % (ref 39.0–52.0)
Hemoglobin: 15.5 g/dL (ref 13.0–17.0)
Lymphocytes Relative: 32.2 % (ref 12.0–46.0)
Lymphs Abs: 3.6 10*3/uL (ref 0.7–4.0)
MCHC: 32.8 g/dL (ref 30.0–36.0)
MCV: 86.3 fl (ref 78.0–100.0)
Monocytes Absolute: 0.8 10*3/uL (ref 0.1–1.0)
Monocytes Relative: 6.7 % (ref 3.0–12.0)
Neutro Abs: 6.7 10*3/uL (ref 1.4–7.7)
Neutrophils Relative %: 59.2 % (ref 43.0–77.0)
Platelets: 238 10*3/uL (ref 150.0–400.0)
RBC: 5.47 Mil/uL (ref 4.22–5.81)
RDW: 13.4 % (ref 11.5–15.5)
WBC: 11.2 10*3/uL — ABNORMAL HIGH (ref 4.0–10.5)

## 2023-11-21 LAB — LIPID PANEL
Cholesterol: 197 mg/dL (ref 0–200)
HDL: 46.9 mg/dL (ref >39.00)
LDL Cholesterol: 114 mg/dL — ABNORMAL HIGH (ref 0–99)
NonHDL: 150.32
Total CHOL/HDL Ratio: 4
Triglycerides: 180 mg/dL — ABNORMAL HIGH (ref 0.0–149.0)
VLDL: 36 mg/dL (ref 0.0–40.0)

## 2023-11-21 LAB — URINALYSIS, ROUTINE W REFLEX MICROSCOPIC
Bilirubin Urine: NEGATIVE
Hgb urine dipstick: NEGATIVE
Ketones, ur: NEGATIVE
Leukocytes,Ua: NEGATIVE
Nitrite: NEGATIVE
Specific Gravity, Urine: 1.025 (ref 1.000–1.030)
Total Protein, Urine: NEGATIVE
Urine Glucose: NEGATIVE
Urobilinogen, UA: 0.2 (ref 0.0–1.0)
pH: 6 (ref 5.0–8.0)

## 2023-11-21 LAB — HEMOGLOBIN A1C: Hgb A1c MFr Bld: 6.1 % (ref 4.6–6.5)

## 2023-11-21 LAB — COMPREHENSIVE METABOLIC PANEL WITH GFR
ALT: 90 U/L — ABNORMAL HIGH (ref 0–53)
AST: 27 U/L (ref 0–37)
Albumin: 4.1 g/dL (ref 3.5–5.2)
Alkaline Phosphatase: 70 U/L (ref 39–117)
BUN: 23 mg/dL (ref 6–23)
CO2: 28 meq/L (ref 19–32)
Calcium: 8.9 mg/dL (ref 8.4–10.5)
Chloride: 107 meq/L (ref 96–112)
Creatinine, Ser: 1.04 mg/dL (ref 0.40–1.50)
GFR: 83.53 mL/min (ref >60.00)
Glucose, Bld: 87 mg/dL (ref 70–99)
Potassium: 3.8 meq/L (ref 3.5–5.1)
Sodium: 144 meq/L (ref 135–145)
Total Bilirubin: 0.3 mg/dL (ref 0.2–1.2)
Total Protein: 6.4 g/dL (ref 6.0–8.3)

## 2023-11-21 LAB — PSA: PSA: 1.63 ng/mL (ref 0.10–4.00)

## 2023-11-21 NOTE — Progress Notes (Addendum)
 L Ugonna enough to go over just with his physical we got enough to do  Established Patient Office Visit   Subjective:  Patient ID: Chad Ruiz., male    DOB: 1971-03-26  Age: 53 y.o. MRN: [id number]  Chief Complaint  Patient presents with   Annual Exam    CPE. Pt is fasting.     HPI Encounter Diagnoses  Name Primary?   Healthcare maintenance Yes   Screening for prostate cancer    Screening for cholesterol level    Screening for diabetes mellitus    Atypical nevus    Lesion of skin of nose    Encounter for hepatitis C screening test for low risk patient    Right low back pain, unspecified chronicity, unspecified whether sciatica present    Unilateral inguinal hernia without obstruction or gangrene, recurrence not specified    Screening for colon cancer    Elevated ALT measurement    Here for physical.  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  He is active on his job but has no regular exercise.  He does have regular dental care.  He lives with his significant other.  He is experienced right lower back pain that radiates around his pelvis to the front.  He has noted a lesion on the left side of his nose that cracks and bleeds.  Positive family history of skin cancers.  He is not certain of their diagnoses.  History of left inguinal hernia status postrepair.  No issues with urine flow or bowel movements.  He believes it is time for his next colonoscopy.   Review of Systems  Constitutional: Negative.   HENT: Negative.    Eyes:  Negative for blurred vision, discharge and redness.  Respiratory: Negative.    Cardiovascular: Negative.   Gastrointestinal:  Negative for abdominal pain, blood in stool, constipation and melena.  Genitourinary: Negative.   Musculoskeletal:  Positive for back pain. Negative for myalgias.  Skin:  Negative for rash.  Neurological:  Negative for tingling, loss of consciousness and weakness.   Endo/Heme/Allergies:  Negative for polydipsia.     Current Outpatient Medications:    predniSONE  (DELTASONE ) 20 MG tablet, Take 2 tablets (40 mg total) by mouth daily with breakfast., Disp: 10 tablet, Rfl: 0   promethazine -dextromethorphan (PROMETHAZINE -DM) 6.25-15 MG/5ML syrup, Take 5 mLs by mouth 4 (four) times daily as needed., Disp: 118 mL, Rfl: 0   Objective:     BP 122/82 (Cuff Size: Normal)   Pulse 80   Temp 97.6 F (36.4 C) (Temporal)   Ht 5' 8 (1.727 m)   Wt 172 lb 3.2 oz (78.1 kg)   SpO2 96%   BMI 26.18 kg/m    Physical Exam Constitutional:      General: He is not in acute distress.    Appearance: Normal appearance. He is not ill-appearing, toxic-appearing or diaphoretic.  HENT:     Head: Normocephalic and atraumatic.     Right Ear: Tympanic membrane, ear canal and external ear normal.     Left Ear: Tympanic membrane, ear canal and external ear normal.     Mouth/Throat:     Mouth: Mucous membranes are moist.     Pharynx: Oropharynx is clear. No oropharyngeal exudate or posterior oropharyngeal erythema.  Eyes:     General: No scleral icterus.       Right eye: No discharge.        Left eye:  No discharge.     Extraocular Movements: Extraocular movements intact.     Conjunctiva/sclera: Conjunctivae normal.     Pupils: Pupils are equal, round, and reactive to light.  Cardiovascular:     Rate and Rhythm: Normal rate and regular rhythm.  Pulmonary:     Effort: Pulmonary effort is normal. No respiratory distress.     Breath sounds: Normal breath sounds. No wheezing, rhonchi or rales.  Abdominal:     General: Bowel sounds are normal.     Tenderness: There is no abdominal tenderness. There is no guarding or rebound.     Hernia: A hernia is present. Hernia is present in the right inguinal area. There is no hernia in the left inguinal area.  Genitourinary:    Penis: Circumcised. No hypospadias, erythema, tenderness, discharge, swelling or lesions.      Testes:         Right: Mass, tenderness or swelling not present. Right testis is descended.        Left: Mass, tenderness or swelling not present. Left testis is descended.     Epididymis:     Right: Not inflamed or enlarged.     Left: Not inflamed or enlarged.  Musculoskeletal:     Cervical back: No rigidity or tenderness.  Lymphadenopathy:     Lower Body: No right inguinal adenopathy. No left inguinal adenopathy.  Skin:    General: Skin is warm and dry.       Neurological:     Mental Status: He is alert and oriented to person, place, and time.  Psychiatric:        Mood and Affect: Mood normal.        Behavior: Behavior normal.   Continue present   Results for orders placed or performed in visit on 11/21/23  CBC with Differential/Platelet  Result Value Ref Range   WBC 11.2 (H) 4.0 - 10.5 K/uL   RBC 5.47 4.22 - 5.81 Mil/uL   Hemoglobin 15.5 13.0 - 17.0 g/dL   HCT 52.7 60.9 - 47.9 %   MCV 86.3 78.0 - 100.0 fl   MCHC 32.8 30.0 - 36.0 g/dL   RDW 86.5 88.4 - 84.4 %   Platelets 238.0 150.0 - 400.0 K/uL   Neutrophils Relative % 59.2 43.0 - 77.0 %   Lymphocytes Relative 32.2 12.0 - 46.0 %   Monocytes Relative 6.7 3.0 - 12.0 %   Eosinophils Relative 1.2 0.0 - 5.0 %   Basophils Relative 0.7 0.0 - 3.0 %   Neutro Abs 6.7 1.4 - 7.7 K/uL   Lymphs Abs 3.6 0.7 - 4.0 K/uL   Monocytes Absolute 0.8 0.1 - 1.0 K/uL   Eosinophils Absolute 0.1 0.0 - 0.7 K/uL   Basophils Absolute 0.1 0.0 - 0.1 K/uL  Comprehensive metabolic panel with GFR  Result Value Ref Range   Sodium 144 135 - 145 mEq/L   Potassium 3.8 3.5 - 5.1 mEq/L   Chloride 107 96 - 112 mEq/L   CO2 28 19 - 32 mEq/L   Glucose, Bld 87 70 - 99 mg/dL   BUN 23 6 - 23 mg/dL   Creatinine, Ser 8.95 0.40 - 1.50 mg/dL   Total Bilirubin 0.3 0.2 - 1.2 mg/dL   Alkaline Phosphatase 70 39 - 117 U/L   AST 27 0 - 37 U/L   ALT 90 (H) 0 - 53 U/L   Total Protein 6.4 6.0 - 8.3 g/dL   Albumin 4.1 3.5 - 5.2 g/dL   GFR 16.46 >39.99  mL/min   Calcium 8.9 8.4  - 10.5 mg/dL  Lipid panel  Result Value Ref Range   Cholesterol 197 0 - 200 mg/dL   Triglycerides 819.9 (H) 0.0 - 149.0 mg/dL   HDL 53.09 >60.99 mg/dL   VLDL 63.9 0.0 - 59.9 mg/dL   LDL Cholesterol 885 (H) 0 - 99 mg/dL   Total CHOL/HDL Ratio 4    NonHDL 150.32   Urinalysis, Routine w reflex microscopic  Result Value Ref Range   Color, Urine YELLOW Yellow;Lt. Yellow;Straw;Dark Yellow;Amber;Green;Red;Brown   APPearance CLEAR Clear;Turbid;Slightly Cloudy;Cloudy   Specific Gravity, Urine 1.025 1.000 - 1.030   pH 6.0 5.0 - 8.0   Total Protein, Urine NEGATIVE Negative   Urine Glucose NEGATIVE Negative   Ketones, ur NEGATIVE Negative   Bilirubin Urine NEGATIVE Negative   Hgb urine dipstick NEGATIVE Negative   Urobilinogen, UA 0.2 0.0 - 1.0   Leukocytes,Ua NEGATIVE Negative   Nitrite NEGATIVE Negative   WBC, UA 0-2/hpf 0-2/hpf   RBC / HPF 0-2/hpf 0-2/hpf   Mucus, UA Presence of (A) None   Squamous Epithelial / HPF Rare(0-4/hpf) Rare(0-4/hpf)  PSA  Result Value Ref Range   PSA 1.63 0.10 - 4.00 ng/mL  Hemoglobin A1c  Result Value Ref Range   Hgb A1c MFr Bld 6.1 4.6 - 6.5 %      The 10-year ASCVD risk score (Arnett DK, et al., 2019) is: 3.3%    Assessment & Plan:   Healthcare maintenance -     CBC with Differential/Platelet -     Urinalysis, Routine w reflex microscopic  Screening for prostate cancer -     PSA  Screening for cholesterol level -     Comprehensive metabolic panel with GFR -     Lipid panel  Screening for diabetes mellitus -     Comprehensive metabolic panel with GFR -     Hemoglobin A1c  Atypical nevus -     Ambulatory referral to Dermatology  Lesion of skin of nose -     Ambulatory referral to Dermatology  Encounter for hepatitis C screening test for low risk patient -     Hepatitis C antibody  Right low back pain, unspecified chronicity, unspecified whether sciatica present -     Ambulatory referral to Sports Medicine  Unilateral inguinal  hernia without obstruction or gangrene, recurrence not specified  Screening for colon cancer -     Ambulatory referral to Gastroenterology  Elevated ALT measurement -     Hepatitis B surface antigen; Future    Return in about 6 months (around 05/22/2024).  Information was given on health maintenance and disease prevention.  Information was also given on inguinal hernia.  He declines recommended referral to general surgery at this point.  Advised that his hernia has the potential to become an medical emergency.  He understands this.  Dermatology referral for atypical nevi and lesion of concern on the left nares.  Sports medicine referral for ongoing right lower back pain.  Referral to GI for follow-up colonoscopy.  Elsie Sim Lent, MD  9/11 addendum: Did not return phone calls for dermatology appointment.

## 2023-11-22 ENCOUNTER — Ambulatory Visit: Payer: Self-pay | Admitting: Family Medicine

## 2023-11-22 LAB — HEPATITIS C ANTIBODY: Hepatitis C Ab: NONREACTIVE

## 2023-11-22 NOTE — Addendum Note (Signed)
 Addended by: Delene Feinstein on: 11/22/2023 08:43 AM   Modules accepted: Orders, Level of Service

## 2023-12-02 NOTE — Progress Notes (Unsigned)
    Ben Jackson D.[facility] [address] [place] [address] Phone: [phone]   Assessment and Plan:     There are no diagnoses linked to this encounter.  ***   Pertinent previous records reviewed include ***    Follow Up: ***     Subjective:    Chief Complaint: Back pain  HPI:   12/06/2023 Davian is a 53 year old male with complaint of right sided back pain. Patient states  Duration? Did you have an Injury to cause this pain? Taking Medication for pain? Numbness or Tingling? Does the pain Radiate?  Altered gait or use? ROM/ impairment of movement?   Relevant Historical Information: ***  Additional pertinent review of systems negative.   Current Outpatient Medications:    predniSONE  (DELTASONE ) 20 MG tablet, Take 2 tablets (40 mg total) by mouth daily with breakfast., Disp: 10 tablet, Rfl: 0   promethazine -dextromethorphan (PROMETHAZINE -DM) 6.25-15 MG/5ML syrup, Take 5 mLs by mouth 4 (four) times daily as needed., Disp: 118 mL, Rfl: 0   Objective:     There were no vitals filed for this visit.    There is no height or weight on file to calculate BMI.    Physical Exam:    ***   Electronically signed by:  Marshall Skeeter D.[facility] 8:57 AM 12/02/23

## 2023-12-06 ENCOUNTER — Ambulatory Visit (INDEPENDENT_AMBULATORY_CARE_PROVIDER_SITE_OTHER)

## 2023-12-06 ENCOUNTER — Ambulatory Visit (INDEPENDENT_AMBULATORY_CARE_PROVIDER_SITE_OTHER): Admitting: Sports Medicine

## 2023-12-06 ENCOUNTER — Ambulatory Visit: Payer: Self-pay | Admitting: Sports Medicine

## 2023-12-06 VITALS — BP 118/82 | HR 102 | Ht 68.0 in | Wt 171.0 lb

## 2023-12-06 DIAGNOSIS — G8929 Other chronic pain: Secondary | ICD-10-CM

## 2023-12-06 DIAGNOSIS — M545 Low back pain, unspecified: Secondary | ICD-10-CM

## 2023-12-06 DIAGNOSIS — M533 Sacrococcygeal disorders, not elsewhere classified: Secondary | ICD-10-CM

## 2023-12-06 MED ORDER — MELOXICAM 15 MG PO TABS: 15.0000 mg | ORAL_TABLET | Freq: Every day | ORAL | 0 refills | Status: AC

## 2023-12-06 NOTE — Patient Instructions (Addendum)
 Good to see you   X ray on the way out   - Start meloxicam 15 mg daily x2 weeks.  If still having pain after 2 weeks, complete 3rd-week of NSAID. May use remaining NSAID as needed once daily for pain control.  Do not to use additional over-the-counter NSAIDs (ibuprofen , naproxen , Advil , Aleve , etc.) while taking prescription NSAIDs.  May use Tylenol  [phone] mg 2 to 3 times a day for breakthrough pain.  Low back and core exercises given   Follow up in 4 weeks

## 2024-01-04 NOTE — Progress Notes (Unsigned)
    Ben Jackson D.[facility] [address] [place] [address] Phone: [phone]   Assessment and Plan:     There are no diagnoses linked to this encounter.  ***   Pertinent previous records reviewed include ***    Follow Up: ***     Subjective:   I, Frazier Balfour, am serving as a Neurosurgeon for Doctor Morene Mace  Chief Complaint: Back pain   HPI:    12/06/2023 Chad Ruiz is a 53 year old male with complaint of right sided back pain. Patient states the pain feels like a deep pain in the lower right side of back. Does not hurt laying on that side or standing but sitting makes it hurt worse. Pain started about 9-10 months ago, thought it was a pulled muscle and it would go away but did not. No MOI was just sitting in the recliner. Ibuprofen  or Excedrin  helps with the pain but wears off. No numbness, tingling or weakness. Pain sometimes radiates down the right leg but not often. Pain usually stays in the lower right side.      01/05/2024 Patient states   Relevant Historical Information: None pertinent  Additional pertinent review of systems negative.   Current Outpatient Medications:    meloxicam  (MOBIC ) 15 MG tablet, Take 1 tablet (15 mg total) by mouth daily., Disp: 30 tablet, Rfl: 0   predniSONE  (DELTASONE ) 20 MG tablet, Take 2 tablets (40 mg total) by mouth daily with breakfast., Disp: 10 tablet, Rfl: 0   promethazine -dextromethorphan (PROMETHAZINE -DM) 6.25-15 MG/5ML syrup, Take 5 mLs by mouth 4 (four) times daily as needed., Disp: 118 mL, Rfl: 0   Objective:     There were no vitals filed for this visit.    There is no height or weight on file to calculate BMI.    Physical Exam:    ***   Electronically signed by:  Odis Mace D.[facility] 7:28 AM 01/04/24

## 2024-01-05 ENCOUNTER — Ambulatory Visit (INDEPENDENT_AMBULATORY_CARE_PROVIDER_SITE_OTHER): Admitting: Sports Medicine

## 2024-01-05 VITALS — BP 132/84 | HR 87 | Ht 68.0 in | Wt 177.0 lb

## 2024-01-05 DIAGNOSIS — M51362 Other intervertebral disc degeneration, lumbar region with discogenic back pain and lower extremity pain: Secondary | ICD-10-CM | POA: Diagnosis not present

## 2024-01-05 DIAGNOSIS — G8929 Other chronic pain: Secondary | ICD-10-CM

## 2024-01-05 MED ORDER — METHYLPREDNISOLONE 4 MG PO TBPK: ORAL_TABLET | ORAL | 0 refills | Status: AC

## 2024-01-05 NOTE — Patient Instructions (Signed)
 Discontinue meloxicam   Prednisone  dos pak Continue HEP  If no improvement 2-3 weeks call and ask for a lumbar MRI  Follow up 1 week after MRI

## 2024-03-01 NOTE — Addendum Note (Signed)
 Addended by: BERNETA FALLOW A on: 03/01/2024 01:08 PM   Modules accepted: Orders
